# Patient Record
Sex: Male | Born: 1980 | Race: Black or African American | Hispanic: No | Marital: Single | Smoking: Current every day smoker
Health system: Southern US, Community
[De-identification: ages and names within clinical notes are randomized; demographics above are authoritative.]

## PROBLEM LIST (undated history)

## (undated) DIAGNOSIS — K219 Gastro-esophageal reflux disease without esophagitis: Secondary | ICD-10-CM

## (undated) DIAGNOSIS — R480 Dyslexia and alexia: Secondary | ICD-10-CM

## (undated) DIAGNOSIS — Z87442 Personal history of urinary calculi: Secondary | ICD-10-CM

## (undated) DIAGNOSIS — N4830 Priapism, unspecified: Secondary | ICD-10-CM

## (undated) DIAGNOSIS — M549 Dorsalgia, unspecified: Secondary | ICD-10-CM

## (undated) DIAGNOSIS — K5732 Diverticulitis of large intestine without perforation or abscess without bleeding: Secondary | ICD-10-CM

## (undated) DIAGNOSIS — M1990 Unspecified osteoarthritis, unspecified site: Secondary | ICD-10-CM

## (undated) DIAGNOSIS — R7303 Prediabetes: Secondary | ICD-10-CM

## (undated) DIAGNOSIS — G5600 Carpal tunnel syndrome, unspecified upper limb: Secondary | ICD-10-CM

## (undated) DIAGNOSIS — J45909 Unspecified asthma, uncomplicated: Secondary | ICD-10-CM

## (undated) DIAGNOSIS — R0989 Other specified symptoms and signs involving the circulatory and respiratory systems: Secondary | ICD-10-CM

## (undated) DIAGNOSIS — G4730 Sleep apnea, unspecified: Secondary | ICD-10-CM

## (undated) DIAGNOSIS — Z9889 Other specified postprocedural states: Secondary | ICD-10-CM

## (undated) DIAGNOSIS — R112 Nausea with vomiting, unspecified: Secondary | ICD-10-CM

## (undated) DIAGNOSIS — I82409 Acute embolism and thrombosis of unspecified deep veins of unspecified lower extremity: Secondary | ICD-10-CM

## (undated) DIAGNOSIS — I2699 Other pulmonary embolism without acute cor pulmonale: Secondary | ICD-10-CM

## (undated) DIAGNOSIS — D649 Anemia, unspecified: Secondary | ICD-10-CM

## (undated) HISTORY — PX: UPPER GASTROINTESTINAL ENDOSCOPY: SHX188

## (undated) HISTORY — PX: COLONOSCOPY: SHX174

## (undated) HISTORY — PX: WRIST SURGERY: SHX841

## (undated) HISTORY — PX: HEMORRHOID SURGERY: SHX153

## (undated) HISTORY — PX: CHOLECYSTECTOMY: SHX55

---
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
## 2003-01-09 ENCOUNTER — Emergency Department (HOSPITAL_COMMUNITY): Admission: EM | Admit: 2003-01-09 | Discharge: 2003-01-09 | Payer: Self-pay | Admitting: Emergency Medicine

## 2003-01-09 ENCOUNTER — Encounter: Payer: Self-pay | Admitting: *Deleted

## 2004-08-08 ENCOUNTER — Emergency Department (HOSPITAL_COMMUNITY): Admission: EM | Admit: 2004-08-08 | Discharge: 2004-08-08 | Payer: Self-pay | Admitting: Emergency Medicine

## 2004-10-31 ENCOUNTER — Emergency Department (HOSPITAL_COMMUNITY): Admission: EM | Admit: 2004-10-31 | Discharge: 2004-10-31 | Payer: Self-pay | Admitting: *Deleted

## 2006-01-30 ENCOUNTER — Emergency Department (HOSPITAL_COMMUNITY): Admission: EM | Admit: 2006-01-30 | Discharge: 2006-01-30 | Payer: Self-pay | Admitting: Emergency Medicine

## 2006-04-25 ENCOUNTER — Emergency Department (HOSPITAL_COMMUNITY): Admission: EM | Admit: 2006-04-25 | Discharge: 2006-04-25 | Payer: Self-pay | Admitting: Emergency Medicine

## 2006-07-29 ENCOUNTER — Emergency Department (HOSPITAL_COMMUNITY): Admission: EM | Admit: 2006-07-29 | Discharge: 2006-07-29 | Payer: Self-pay | Admitting: Emergency Medicine

## 2006-11-09 ENCOUNTER — Emergency Department (HOSPITAL_COMMUNITY): Admission: EM | Admit: 2006-11-09 | Discharge: 2006-11-09 | Payer: Self-pay | Admitting: Emergency Medicine

## 2007-03-30 ENCOUNTER — Emergency Department (HOSPITAL_COMMUNITY): Admission: EM | Admit: 2007-03-30 | Discharge: 2007-03-30 | Payer: Self-pay | Admitting: Emergency Medicine

## 2007-11-01 ENCOUNTER — Emergency Department (HOSPITAL_COMMUNITY): Admission: EM | Admit: 2007-11-01 | Discharge: 2007-11-01 | Payer: Self-pay | Admitting: Emergency Medicine

## 2007-11-12 ENCOUNTER — Emergency Department (HOSPITAL_COMMUNITY): Admission: EM | Admit: 2007-11-12 | Discharge: 2007-11-12 | Payer: Self-pay | Admitting: Emergency Medicine

## 2008-03-26 ENCOUNTER — Emergency Department (HOSPITAL_COMMUNITY): Admission: EM | Admit: 2008-03-26 | Discharge: 2008-03-26 | Payer: Self-pay | Admitting: Family Medicine

## 2008-03-30 ENCOUNTER — Emergency Department (HOSPITAL_COMMUNITY): Admission: EM | Admit: 2008-03-30 | Discharge: 2008-03-30 | Payer: Self-pay | Admitting: Family Medicine

## 2008-05-16 ENCOUNTER — Emergency Department (HOSPITAL_COMMUNITY): Admission: EM | Admit: 2008-05-16 | Discharge: 2008-05-16 | Payer: Self-pay | Admitting: Emergency Medicine

## 2008-08-22 ENCOUNTER — Emergency Department (HOSPITAL_COMMUNITY): Admission: EM | Admit: 2008-08-22 | Discharge: 2008-08-22 | Payer: Self-pay | Admitting: Emergency Medicine

## 2009-03-13 ENCOUNTER — Emergency Department (HOSPITAL_COMMUNITY): Admission: EM | Admit: 2009-03-13 | Discharge: 2009-03-13 | Payer: Self-pay | Admitting: Emergency Medicine

## 2009-12-07 ENCOUNTER — Emergency Department (HOSPITAL_COMMUNITY): Admission: EM | Admit: 2009-12-07 | Discharge: 2009-12-08 | Payer: Self-pay | Admitting: Emergency Medicine

## 2009-12-08 ENCOUNTER — Emergency Department (HOSPITAL_COMMUNITY): Admission: EM | Admit: 2009-12-08 | Discharge: 2009-12-08 | Payer: Self-pay | Admitting: Emergency Medicine

## 2009-12-29 ENCOUNTER — Emergency Department (HOSPITAL_COMMUNITY)
Admission: EM | Admit: 2009-12-29 | Discharge: 2009-12-29 | Payer: Self-pay | Source: Home / Self Care | Admitting: Emergency Medicine

## 2010-01-09 ENCOUNTER — Emergency Department (HOSPITAL_COMMUNITY)
Admission: EM | Admit: 2010-01-09 | Discharge: 2010-01-10 | Payer: Self-pay | Source: Home / Self Care | Admitting: Emergency Medicine

## 2010-06-23 ENCOUNTER — Emergency Department (HOSPITAL_COMMUNITY)
Admission: EM | Admit: 2010-06-23 | Discharge: 2010-06-23 | Payer: Self-pay | Source: Home / Self Care | Admitting: Emergency Medicine

## 2010-06-30 ENCOUNTER — Inpatient Hospital Stay (INDEPENDENT_AMBULATORY_CARE_PROVIDER_SITE_OTHER)
Admission: RE | Admit: 2010-06-30 | Discharge: 2010-06-30 | Disposition: A | Discharge status: Home / Self Care | Payer: Worker's Compensation | Source: Ambulatory Visit | Attending: Family Medicine | Admitting: Family Medicine

## 2010-06-30 DIAGNOSIS — S61209A Unspecified open wound of unspecified finger without damage to nail, initial encounter: Secondary | ICD-10-CM

## 2010-07-08 ENCOUNTER — Inpatient Hospital Stay (HOSPITAL_COMMUNITY)
Admission: RE | Admit: 2010-07-08 | Discharge: 2010-07-08 | Disposition: A | Discharge status: Home / Self Care | Payer: Worker's Compensation | Source: Ambulatory Visit | Attending: Emergency Medicine | Admitting: Emergency Medicine

## 2010-07-09 ENCOUNTER — Emergency Department (HOSPITAL_COMMUNITY)
Admission: EM | Admit: 2010-07-09 | Discharge: 2010-07-09 | Disposition: A | Discharge status: Home / Self Care | Payer: PRIVATE HEALTH INSURANCE | Attending: Emergency Medicine | Admitting: Emergency Medicine

## 2010-07-09 DIAGNOSIS — Y849 Medical procedure, unspecified as the cause of abnormal reaction of the patient, or of later complication, without mention of misadventure at the time of the procedure: Secondary | ICD-10-CM | POA: Insufficient documentation

## 2010-07-09 DIAGNOSIS — S61209A Unspecified open wound of unspecified finger without damage to nail, initial encounter: Secondary | ICD-10-CM | POA: Insufficient documentation

## 2010-07-09 DIAGNOSIS — T8131XA Disruption of external operation (surgical) wound, not elsewhere classified, initial encounter: Secondary | ICD-10-CM | POA: Insufficient documentation

## 2010-07-13 ENCOUNTER — Emergency Department (HOSPITAL_COMMUNITY)
Admission: EM | Admit: 2010-07-13 | Discharge: 2010-07-13 | Disposition: A | Discharge status: Home / Self Care | Payer: PRIVATE HEALTH INSURANCE | Attending: Emergency Medicine | Admitting: Emergency Medicine

## 2010-07-13 DIAGNOSIS — Z711 Person with feared health complaint in whom no diagnosis is made: Secondary | ICD-10-CM | POA: Insufficient documentation

## 2010-08-12 LAB — DIFFERENTIAL
Basophils Absolute: 0 10*3/uL (ref 0.0–0.1)
Eosinophils Absolute: 0.2 10*3/uL (ref 0.0–0.7)
Eosinophils Relative: 3 % (ref 0–5)
Lymphocytes Relative: 27 % (ref 12–46)
Monocytes Relative: 6 % (ref 3–12)
Neutro Abs: 4.7 10*3/uL (ref 1.7–7.7)
Neutrophils Relative %: 64 % (ref 43–77)

## 2010-08-12 LAB — COMPREHENSIVE METABOLIC PANEL
AST: 34 U/L (ref 0–37)
Albumin: 3.7 g/dL (ref 3.5–5.2)
BUN: 11 mg/dL (ref 6–23)
Calcium: 9.2 mg/dL (ref 8.4–10.5)
Chloride: 106 mEq/L (ref 96–112)
Creatinine, Ser: 0.92 mg/dL (ref 0.4–1.5)
GFR calc Af Amer: >60 mL/min (ref >60)
Glucose, Bld: 106 mg/dL — ABNORMAL HIGH (ref 70–99)
Potassium: 4.3 mEq/L (ref 3.5–5.1)

## 2010-08-12 LAB — CBC
Hemoglobin: 14.2 g/dL (ref 13.0–17.0)
MCH: 27.5 pg (ref 26.0–34.0)
RBC: 5.16 MIL/uL (ref 4.22–5.81)
RDW: 16.6 % — ABNORMAL HIGH (ref 11.5–15.5)

## 2010-08-12 LAB — LIPASE, BLOOD: Lipase: 24 U/L (ref 11–59)

## 2010-08-12 LAB — CLOSTRIDIUM DIFFICILE EIA

## 2010-08-12 LAB — OVA AND PARASITE EXAMINATION

## 2010-08-12 LAB — STOOL CULTURE

## 2010-08-14 LAB — CBC
Hemoglobin: 14.6 g/dL (ref 13.0–17.0)
MCH: 27.4 pg (ref 26.0–34.0)
MCHC: 33.2 g/dL (ref 30.0–36.0)
Platelets: 243 10*3/uL (ref 150–400)
WBC: 12 10*3/uL — ABNORMAL HIGH (ref 4.0–10.5)

## 2010-08-14 LAB — COMPREHENSIVE METABOLIC PANEL
AST: 30 U/L (ref 0–37)
Albumin: 3.5 g/dL (ref 3.5–5.2)
Alkaline Phosphatase: 86 U/L (ref 39–117)
CO2: 23 mEq/L (ref 19–32)
Chloride: 107 mEq/L (ref 96–112)
Creatinine, Ser: 0.97 mg/dL (ref 0.4–1.5)
GFR calc non Af Amer: >60 mL/min (ref >60)
Potassium: 4 mEq/L (ref 3.5–5.1)
Total Bilirubin: 0.3 mg/dL (ref 0.3–1.2)

## 2010-08-14 LAB — DIFFERENTIAL
Basophils Absolute: 0 10*3/uL (ref 0.0–0.1)
Eosinophils Relative: 0 % (ref 0–5)
Lymphocytes Relative: 8 % — ABNORMAL LOW (ref 12–46)
Lymphs Abs: 1 10*3/uL (ref 0.7–4.0)

## 2010-08-14 LAB — LIPASE, BLOOD: Lipase: 20 U/L (ref 11–59)

## 2010-10-06 ENCOUNTER — Emergency Department (HOSPITAL_COMMUNITY)
Admission: EM | Admit: 2010-10-06 | Discharge: 2010-10-06 | Disposition: A | Discharge status: Home / Self Care | Payer: Self-pay | Attending: Emergency Medicine | Admitting: Emergency Medicine

## 2010-10-06 DIAGNOSIS — R209 Unspecified disturbances of skin sensation: Secondary | ICD-10-CM | POA: Insufficient documentation

## 2010-10-06 LAB — GLUCOSE, CAPILLARY: Glucose-Capillary: 132 mg/dL — ABNORMAL HIGH (ref 70–99)

## 2011-02-15 ENCOUNTER — Emergency Department (HOSPITAL_COMMUNITY): Payer: Self-pay

## 2011-02-15 ENCOUNTER — Emergency Department (HOSPITAL_COMMUNITY)
Admission: EM | Admit: 2011-02-15 | Discharge: 2011-02-15 | Disposition: A | Discharge status: Home / Self Care | Payer: Self-pay | Attending: Emergency Medicine | Admitting: Emergency Medicine

## 2011-02-15 DIAGNOSIS — M940 Chondrocostal junction syndrome [Tietze]: Secondary | ICD-10-CM | POA: Insufficient documentation

## 2011-02-15 DIAGNOSIS — R079 Chest pain, unspecified: Secondary | ICD-10-CM | POA: Insufficient documentation

## 2011-02-15 DIAGNOSIS — B9789 Other viral agents as the cause of diseases classified elsewhere: Secondary | ICD-10-CM | POA: Insufficient documentation

## 2011-02-15 LAB — CBC
HCT: 43.8 % (ref 39.0–52.0)
Hemoglobin: 14.2 g/dL (ref 13.0–17.0)
MCV: 81.7 fL (ref 78.0–100.0)
WBC: 7.9 10*3/uL (ref 4.0–10.5)

## 2011-02-15 LAB — DIFFERENTIAL
Basophils Absolute: 0 10*3/uL (ref 0.0–0.1)
Lymphocytes Relative: 29 % (ref 12–46)
Lymphs Abs: 2.3 10*3/uL (ref 0.7–4.0)
Neutro Abs: 4.8 10*3/uL (ref 1.7–7.7)

## 2011-02-15 LAB — BASIC METABOLIC PANEL
BUN: 12 mg/dL (ref 6–23)
CO2: 27 mEq/L (ref 19–32)
Chloride: 100 mEq/L (ref 96–112)
Glucose, Bld: 80 mg/dL (ref 70–99)
Potassium: 3.8 mEq/L (ref 3.5–5.1)

## 2011-02-15 LAB — POCT I-STAT TROPONIN I

## 2011-02-22 ENCOUNTER — Emergency Department (HOSPITAL_COMMUNITY)
Admission: EM | Admit: 2011-02-22 | Discharge: 2011-02-22 | Discharge status: Against Medical Advice | Payer: PRIVATE HEALTH INSURANCE | Attending: Emergency Medicine | Admitting: Emergency Medicine

## 2011-02-22 ENCOUNTER — Emergency Department (HOSPITAL_COMMUNITY): Payer: PRIVATE HEALTH INSURANCE

## 2011-02-22 DIAGNOSIS — R5381 Other malaise: Secondary | ICD-10-CM | POA: Insufficient documentation

## 2011-02-22 DIAGNOSIS — R42 Dizziness and giddiness: Secondary | ICD-10-CM | POA: Insufficient documentation

## 2011-02-23 ENCOUNTER — Emergency Department (HOSPITAL_COMMUNITY)
Admission: EM | Admit: 2011-02-23 | Discharge: 2011-02-24 | Disposition: A | Discharge status: Home / Self Care | Payer: Self-pay | Attending: Emergency Medicine | Admitting: Emergency Medicine

## 2011-02-23 DIAGNOSIS — H6690 Otitis media, unspecified, unspecified ear: Secondary | ICD-10-CM | POA: Insufficient documentation

## 2011-02-23 DIAGNOSIS — E669 Obesity, unspecified: Secondary | ICD-10-CM | POA: Insufficient documentation

## 2011-02-23 DIAGNOSIS — R51 Headache: Secondary | ICD-10-CM | POA: Insufficient documentation

## 2011-02-23 DIAGNOSIS — H9209 Otalgia, unspecified ear: Secondary | ICD-10-CM | POA: Insufficient documentation

## 2011-02-24 LAB — URINALYSIS, ROUTINE W REFLEX MICROSCOPIC
Glucose, UA: NEGATIVE mg/dL
Protein, ur: NEGATIVE mg/dL
Specific Gravity, Urine: 1.027 (ref 1.005–1.030)
pH: 6 (ref 5.0–8.0)

## 2011-02-24 LAB — POCT I-STAT, CHEM 8
BUN: 12 mg/dL (ref 6–23)
Chloride: 99 mEq/L (ref 96–112)
Potassium: 3.7 mEq/L (ref 3.5–5.1)
Sodium: 138 mEq/L (ref 135–145)
TCO2: 25 mmol/L (ref 0–100)

## 2011-02-24 LAB — URINE MICROSCOPIC-ADD ON

## 2011-02-27 LAB — URINALYSIS, ROUTINE W REFLEX MICROSCOPIC
Glucose, UA: NEGATIVE
Hgb urine dipstick: NEGATIVE
Specific Gravity, Urine: 1.017

## 2011-02-27 LAB — CBC
HCT: 36.7 — ABNORMAL LOW
MCV: 75.6 — ABNORMAL LOW
Platelets: 210
RDW: 19.5 — ABNORMAL HIGH

## 2011-02-27 LAB — URINE MICROSCOPIC-ADD ON

## 2011-02-27 LAB — DIFFERENTIAL
Lymphocytes Relative: 4 — ABNORMAL LOW
Monocytes Absolute: 1.3 — ABNORMAL HIGH
Monocytes Relative: 6
Neutro Abs: 18.4 — ABNORMAL HIGH

## 2011-02-27 LAB — COMPREHENSIVE METABOLIC PANEL
Albumin: 2.9 — ABNORMAL LOW
BUN: 5 — ABNORMAL LOW
Creatinine, Ser: 0.94
Potassium: 3.5
Total Protein: 6.5

## 2011-02-27 LAB — URINE CULTURE

## 2011-02-27 LAB — RAPID STREP SCREEN (MED CTR MEBANE ONLY): Streptococcus, Group A Screen (Direct): POSITIVE — AB

## 2011-03-02 LAB — DIFFERENTIAL
Eosinophils Absolute: 0 10*3/uL (ref 0.0–0.7)
Eosinophils Relative: 0 % (ref 0–5)
Lymphs Abs: 0.2 10*3/uL — ABNORMAL LOW (ref 0.7–4.0)
Monocytes Absolute: 0.4 10*3/uL (ref 0.1–1.0)

## 2011-03-02 LAB — CBC
HCT: 42.9 % (ref 39.0–52.0)
Hemoglobin: 13.7 g/dL (ref 13.0–17.0)
MCV: 77.3 fL — ABNORMAL LOW (ref 78.0–100.0)
Platelets: 285 10*3/uL (ref 150–400)
WBC: 8.7 10*3/uL (ref 4.0–10.5)

## 2011-03-02 LAB — BASIC METABOLIC PANEL
BUN: 14 mg/dL (ref 6–23)
Chloride: 104 mEq/L (ref 96–112)
Glucose, Bld: 117 mg/dL — ABNORMAL HIGH (ref 70–99)
Potassium: 4.2 mEq/L (ref 3.5–5.1)

## 2011-03-07 LAB — URINALYSIS, ROUTINE W REFLEX MICROSCOPIC
Bilirubin Urine: NEGATIVE
Glucose, UA: NEGATIVE
Specific Gravity, Urine: 1.028
pH: 8

## 2011-03-07 LAB — GC/CHLAMYDIA PROBE AMP, GENITAL
Chlamydia, DNA Probe: NEGATIVE
GC Probe Amp, Genital: NEGATIVE

## 2011-03-07 LAB — URINE MICROSCOPIC-ADD ON

## 2011-03-07 LAB — POCT URINE HEMOGLOBIN: Operator id: 22937

## 2011-03-16 LAB — BASIC METABOLIC PANEL
BUN: 5 — ABNORMAL LOW
Calcium: 9
Creatinine, Ser: 0.93
GFR calc Af Amer: >60
GFR calc non Af Amer: >60

## 2011-03-16 LAB — DIFFERENTIAL
Eosinophils Absolute: 0.1
Lymphocytes Relative: 18
Lymphs Abs: 1
Neutro Abs: 4
Neutrophils Relative %: 72

## 2011-03-16 LAB — SICKLE CELL SCREEN: Sickle Cell Screen: NEGATIVE

## 2011-03-16 LAB — CBC
MCV: 71.2 — ABNORMAL LOW
Platelets: 309
WBC: 5.5

## 2011-03-27 ENCOUNTER — Emergency Department (HOSPITAL_COMMUNITY): Payer: Self-pay

## 2011-03-27 ENCOUNTER — Emergency Department (HOSPITAL_COMMUNITY)
Admission: EM | Admit: 2011-03-27 | Discharge: 2011-03-27 | Disposition: A | Discharge status: Home / Self Care | Payer: Self-pay | Attending: Emergency Medicine | Admitting: Emergency Medicine

## 2011-03-27 DIAGNOSIS — M79609 Pain in unspecified limb: Secondary | ICD-10-CM | POA: Insufficient documentation

## 2011-05-12 ENCOUNTER — Emergency Department (HOSPITAL_COMMUNITY)
Admission: EM | Admit: 2011-05-12 | Discharge: 2011-05-12 | Disposition: A | Discharge status: Home / Self Care | Payer: Self-pay | Attending: Emergency Medicine | Admitting: Emergency Medicine

## 2011-05-12 ENCOUNTER — Encounter: Payer: Self-pay | Admitting: Emergency Medicine

## 2011-05-12 DIAGNOSIS — R3915 Urgency of urination: Secondary | ICD-10-CM | POA: Insufficient documentation

## 2011-05-12 DIAGNOSIS — R109 Unspecified abdominal pain: Secondary | ICD-10-CM | POA: Insufficient documentation

## 2011-05-12 DIAGNOSIS — G4483 Primary cough headache: Secondary | ICD-10-CM | POA: Insufficient documentation

## 2011-05-12 DIAGNOSIS — R110 Nausea: Secondary | ICD-10-CM | POA: Insufficient documentation

## 2011-05-12 DIAGNOSIS — R6883 Chills (without fever): Secondary | ICD-10-CM | POA: Insufficient documentation

## 2011-05-12 DIAGNOSIS — H938X9 Other specified disorders of ear, unspecified ear: Secondary | ICD-10-CM | POA: Insufficient documentation

## 2011-05-12 LAB — URINALYSIS, ROUTINE W REFLEX MICROSCOPIC
Ketones, ur: NEGATIVE mg/dL
Leukocytes, UA: NEGATIVE
Nitrite: NEGATIVE
pH: 6.5 (ref 5.0–8.0)

## 2011-05-12 MED ORDER — ONDANSETRON 8 MG PO TBDP
8.0000 mg | ORAL_TABLET | Freq: Once | ORAL | Status: AC
  Administered 2011-05-12: 8 mg via ORAL
  Filled 2011-05-12: qty 1

## 2011-05-12 MED ORDER — ONDANSETRON HCL 4 MG PO TABS: 4.0000 mg | ORAL_TABLET | Freq: Four times a day (QID) | ORAL | Status: AC

## 2011-05-12 MED ORDER — IBUPROFEN 800 MG PO TABS
800.0000 mg | ORAL_TABLET | Freq: Once | ORAL | Status: AC
  Administered 2011-05-12: 800 mg via ORAL
  Filled 2011-05-12: qty 1

## 2011-05-12 NOTE — ED Notes (Signed)
Patient stable upon discharge.  

## 2011-05-12 NOTE — ED Provider Notes (Signed)
History     CSN: [id number] Arrival date & time: 05/12/2011  5:26 PM   First MD Initiated Contact with Patient 05/12/11 2047      Chief Complaint  Patient presents with  . Abdominal Pain    (Consider location/radiation/quality/duration/timing/severity/associated sxs/prior treatment) Patient is a 30 y.o. male presenting with abdominal pain. The history is provided by the patient. No language interpreter was used.  Abdominal Pain The primary symptoms of the illness include abdominal pain and nausea. The primary symptoms of the illness do not include fever, fatigue, vomiting, diarrhea, hematemesis, hematochezia or dysuria. The current episode started 3 to 5 hours ago. The onset of the illness was gradual. The problem has been gradually worsening.  The illness is associated with awakening from sleep. The patient states that she believes she is currently not pregnant. The patient has not had a change in bowel habit. Additional symptoms associated with the illness include chills, diaphoresis, urgency, frequency and back pain. Symptoms associated with the illness do not include anorexia, heartburn, constipation or hematuria. Significant associated medical issues do not include PUD, GERD, inflammatory bowel disease, diabetes, sickle cell disease, gallstones, liver disease, substance abuse, diverticulitis, HIV or cardiac disease.  Reports cough and sore throat x 2 days.  Woke around 4 pm today with bilateral side pain 10/10 and nauseated.  Also c/o urinary problems such as urgency, frequency and back pain.  States that his girlfriend has the flu. Low grade fever. No flu shot.  No past medical history on file.  Past Surgical History  Procedure Date  . Cholecystectomy     No family history on file.  History  Substance Use Topics  . Smoking status: Current Everyday Smoker  . Smokeless tobacco: Never Used  . Alcohol Use: Yes      Review of Systems  Constitutional: Positive for chills and  diaphoresis. Negative for fever and fatigue.  Gastrointestinal: Positive for nausea and abdominal pain. Negative for heartburn, vomiting, diarrhea, constipation, hematochezia, anorexia and hematemesis.  Genitourinary: Positive for urgency and frequency. Negative for dysuria and hematuria.  Musculoskeletal: Positive for back pain.  All other systems reviewed and are negative.    Allergies  Review of patient's allergies indicates no known allergies.  Home Medications   Current Outpatient Rx  Name Route Sig Dispense Refill  . ACETAMINOPHEN 500 MG PO TABS Oral Take 1,000 mg by mouth every 6 (six) hours as needed. pain     . GUAIFENESIN ER 600 MG PO TB12 Oral Take 1,200 mg by mouth 2 (two) times daily.        BP 144/73  Pulse 79  Temp(Src) 97.9 F (36.6 C) (Oral)  Resp 20  SpO2 99%  Physical Exam  Constitutional: He is oriented to person, place, and time. He appears well-developed and well-nourished.       Morbidly obese  HENT:  Right Ear: There is swelling and tenderness. No drainage. Tympanic membrane is erythematous.  Left Ear: Tympanic membrane normal. No drainage, swelling or tenderness.  Eyes: Pupils are equal, round, and reactive to light.  Neck: Normal range of motion. Neck supple.  Cardiovascular: Normal rate.   Pulmonary/Chest: Effort normal and breath sounds normal. No respiratory distress. He has no wheezes. He has no rales.  Abdominal: Soft. Bowel sounds are normal. He exhibits no distension and no mass. There is no tenderness. There is no rebound and no guarding.  Musculoskeletal: Normal range of motion. He exhibits tenderness.       General muscle aches and  pains  Neurological: He is alert and oriented to person, place, and time.  Skin: Skin is warm and dry.  Psychiatric: He has a normal mood and affect.    ED Course  Procedures (including critical care time)  Labs Reviewed - No data to display No results found.   No diagnosis found.    MDM  Flulike  symptoms x2 days with sick contacts. States that his girlfriend had the same symptoms. Some relief with Zofran and ibuprofen and benadryl in the ER today. Tolerating by mouth's. Will return if worse no PCP.        Jethro Bastos, NP 05/13/11 [phone]

## 2011-05-12 NOTE — ED Notes (Signed)
Pt reports abdominal pain that started 1 hour ago. Pt states pain is mainly on his right side and into right flank area but does have some pain on the left. Pt nauseated, denies vomiting and diarrhea. Pt also has cough for the past 3 days.

## 2011-05-13 ENCOUNTER — Encounter (HOSPITAL_COMMUNITY): Payer: Self-pay | Admitting: Emergency Medicine

## 2011-05-14 NOTE — ED Provider Notes (Signed)
Medical screening examination/treatment/procedure(s) were performed by non-physician practitioner and as supervising physician I was immediately available for consultation/collaboration.  Jamine Highfill M Dailey Buccheri, MD 05/14/11 0729 

## 2011-07-20 ENCOUNTER — Emergency Department (HOSPITAL_COMMUNITY)
Admission: EM | Admit: 2011-07-20 | Discharge: 2011-07-20 | Disposition: A | Discharge status: Home / Self Care | Payer: PRIVATE HEALTH INSURANCE | Attending: Emergency Medicine | Admitting: Emergency Medicine

## 2011-07-20 ENCOUNTER — Emergency Department (HOSPITAL_COMMUNITY): Payer: PRIVATE HEALTH INSURANCE

## 2011-07-20 ENCOUNTER — Encounter (HOSPITAL_COMMUNITY): Payer: Self-pay | Admitting: Emergency Medicine

## 2011-07-20 DIAGNOSIS — M545 Low back pain, unspecified: Secondary | ICD-10-CM | POA: Insufficient documentation

## 2011-07-20 DIAGNOSIS — R209 Unspecified disturbances of skin sensation: Secondary | ICD-10-CM | POA: Insufficient documentation

## 2011-07-20 DIAGNOSIS — M79609 Pain in unspecified limb: Secondary | ICD-10-CM | POA: Insufficient documentation

## 2011-07-20 DIAGNOSIS — F17200 Nicotine dependence, unspecified, uncomplicated: Secondary | ICD-10-CM | POA: Insufficient documentation

## 2011-07-20 MED ORDER — DIAZEPAM 5 MG PO TABS: 5.0000 mg | ORAL_TABLET | Freq: Four times a day (QID) | ORAL | Status: AC | PRN

## 2011-07-20 MED ORDER — HYDROCODONE-ACETAMINOPHEN 5-325 MG PO TABS
1.0000 | ORAL_TABLET | Freq: Once | ORAL | Status: AC
  Administered 2011-07-20: 1 via ORAL
  Filled 2011-07-20: qty 1

## 2011-07-20 MED ORDER — DIAZEPAM 5 MG PO TABS
5.0000 mg | ORAL_TABLET | Freq: Once | ORAL | Status: AC
  Administered 2011-07-20: 5 mg via ORAL
  Filled 2011-07-20: qty 1

## 2011-07-20 MED ORDER — HYDROCODONE-ACETAMINOPHEN 5-325 MG PO TABS: 1.0000 | ORAL_TABLET | ORAL | Status: AC | PRN

## 2011-07-20 NOTE — ED Notes (Signed)
Pt has been having back pain since the beginning of this year and states that it just keeps getting worse. Takes 800mg  ibuprofen with no relief. States he works on his feet. No urinary symptoms.

## 2011-07-20 NOTE — ED Provider Notes (Signed)
Medical screening examination/treatment/procedure(s) were performed by non-physician practitioner and as supervising physician I was immediately available for consultation/collaboration.   Ladislao Cohenour, MD 07/20/11 2354 

## 2011-07-20 NOTE — Discharge Instructions (Signed)
Back Pain, Adult Low back pain is very common. About 1 in 5 people have back pain.The cause of low back pain is rarely dangerous. The pain often gets better over time.About half of people with a sudden onset of back pain feel better in just 2 weeks. About 8 in 10 people feel better by 6 weeks.  CAUSES Some common causes of back pain include:  Strain of the muscles or ligaments supporting the spine.   Wear and tear (degeneration) of the spinal discs.   Arthritis.   Direct injury to the back.  DIAGNOSIS Most of the time, the direct cause of low back pain is not known.However, back pain can be treated effectively even when the exact cause of the pain is unknown.Answering your caregiver's questions about your overall health and symptoms is one of the most accurate ways to make sure the cause of your pain is not dangerous. If your caregiver needs more information, he or she may order lab work or imaging tests (X-rays or MRIs).However, even if imaging tests show changes in your back, this usually does not require surgery. HOME CARE INSTRUCTIONS For many people, back pain returns.Since low back pain is rarely dangerous, it is often a condition that people can learn to manageon their own.   Remain active. It is stressful on the back to sit or stand in one place. Do not sit, drive, or stand in one place for more than 30 minutes at a time. Take short walks on level surfaces as soon as pain allows.Try to increase the length of time you walk each day.   Do not stay in bed.Resting more than 1 or 2 days can delay your recovery.   Do not avoid exercise or work.Your body is made to move.It is not dangerous to be active, even though your back may hurt.Your back will likely heal faster if you return to being active before your pain is gone.   Pay attention to your body when you bend and lift. Many people have less discomfortwhen lifting if they bend their knees, keep the load close to their  bodies,and avoid twisting. Often, the most comfortable positions are those that put less stress on your recovering back.   Find a comfortable position to sleep. Use a firm mattress and lie on your side with your knees slightly bent. If you lie on your back, put a pillow under your knees.   Only take over-the-counter or prescription medicines as directed by your caregiver. Over-the-counter medicines to reduce pain and inflammation are often the most helpful.Your caregiver may prescribe muscle relaxant drugs.These medicines help dull your pain so you can more quickly return to your normal activities and healthy exercise.   Put ice on the injured area.   Put ice in a plastic bag.   Place a towel between your skin and the bag.   Leave the ice on for 15 to 20 minutes, 3 to 4 times a day for the first 2 to 3 days. After that, ice and heat may be alternated to reduce pain and spasms.   Ask your caregiver about trying back exercises and gentle massage. This may be of some benefit.   Avoid feeling anxious or stressed.Stress increases muscle tension and can worsen back pain.It is important to recognize when you are anxious or stressed and learn ways to manage it.Exercise is a great option.  SEEK MEDICAL CARE IF:  You have pain that is not relieved with rest or medicine.   You have   pain that does not improve in 1 week.   You have new symptoms.   You are generally not feeling well.  SEEK IMMEDIATE MEDICAL CARE IF:   You have pain that radiates from your back into your legs.   You develop new bowel or bladder control problems.   You have unusual weakness or numbness in your arms or legs.   You develop nausea or vomiting.   You develop abdominal pain.   You feel faint.  Document Released: 05/15/2005 Document Revised: 01/25/2011 Document Reviewed: 10/03/2010 Exit[employer] Patient Information 2012 [employer].Lumbosacral Strain Lumbosacral strain is one of the most common causes of back  pain. There are many causes of back pain. Most are not serious conditions. CAUSES  Your backbone (spinal column) is made up of 24 main vertebral bodies, the sacrum, and the coccyx. These are held together by muscles and tough, fibrous tissue (ligaments). Nerve roots pass through the openings between the vertebrae. A sudden move or injury to the back may cause injury to, or pressure on, these nerves. This may result in localized back pain or pain movement (radiation) into the buttocks, down the leg, and into the foot. Sharp, shooting pain from the buttock down the back of the leg (sciatica) is frequently associated with a ruptured (herniated) disk. Pain may be caused by muscle spasm alone. Your caregiver can often find the cause of your pain by the details of your symptoms and an exam. In some cases, you may need tests (such as X-rays). Your caregiver will work with you to decide if any tests are needed based on your specific exam. HOME CARE INSTRUCTIONS   Avoid an underactive lifestyle. Active exercise, as directed by your caregiver, is your greatest weapon against back pain.   Avoid hard physical activities (tennis, racquetball, waterskiing) if you are not in proper physical condition for it. This may aggravate or create problems.   If you have a back problem, avoid sports requiring sudden body movements. Swimming and walking are generally safer activities.   Maintain good posture.   Avoid becoming overweight (obese).   Use bed rest for only the most extreme, sudden (acute) episode. Your caregiver will help you determine how much bed rest is necessary.   For acute conditions, you may put ice on the injured area.   Put ice in a plastic bag.   Place a towel between your skin and the bag.   Leave the ice on for 15 to 20 minutes at a time, every 2 hours, or as needed.   After you are improved and more active, it may help to apply heat for 30 minutes before activities.  See your caregiver if  you are having pain that lasts longer than expected. Your caregiver can advise appropriate exercises or therapy if needed. With conditioning, most back problems can be avoided. SEEK IMMEDIATE MEDICAL CARE IF:   You have numbness, tingling, weakness, or problems with the use of your arms or legs.   You experience severe back pain not relieved with medicines.   There is a change in bowel or bladder control.   You have increasing pain in any area of the body, including your belly (abdomen).   You notice shortness of breath, dizziness, or feel faint.   You feel sick to your stomach (nauseous), are throwing up (vomiting), or become sweaty.   You notice discoloration of your toes or legs, or your feet get very cold.   Your back pain is getting worse.     You have a fever.  MAKE SURE YOU:   Understand these instructions.   Will watch your condition.   Will get help right away if you are not doing well or get worse.  Document Released: 02/22/2005 Document Revised: 01/25/2011 Document Reviewed: 08/14/2008 Exit[employer] Patient Information 2012 [employer]. 

## 2011-07-20 NOTE — ED Provider Notes (Signed)
History     CSN: [id number]  Arrival date & time 07/20/11  1478   First MD Initiated Contact with Patient 07/20/11 1901      Chief Complaint  Patient presents with  . Back Pain    (Consider location/radiation/quality/duration/timing/severity/associated sxs/prior treatment) HPI Comments: Patient here with worsening lower back pain with radiation down to right thigh - reports that he has also been having periodic numbness and tingling in the right leg as well - states no fever, chills, weakness, reports no loss of control of bowels or bladder -   Patient is a 31 y.o. male presenting with back pain. The history is provided by the patient. No language interpreter was used.  Back Pain  This is a recurrent problem. The current episode started more than 1 week ago. The problem occurs constantly. The problem has not changed since onset.The pain is associated with no known injury. The pain is present in the lumbar spine. The quality of the pain is described as stabbing. The pain radiates to the right thigh. The pain is at a severity of 10/10. The pain is severe. The symptoms are aggravated by bending and twisting. The pain is the same all the time. Stiffness is present all day. Associated symptoms include numbness, leg pain and tingling. Pertinent negatives include no chest pain, no fever, no weight loss, no headaches, no abdominal pain, no abdominal swelling, no bowel incontinence, no perianal numbness, no bladder incontinence, no dysuria, no pelvic pain, no paresthesias, no paresis and no weakness. He has tried nothing for the symptoms. The treatment provided no relief. Risk factors include obesity and lack of exercise.    History reviewed. No pertinent past medical history.  Past Surgical History  Procedure Date  . Cholecystectomy   . Wrist surgery     No family history on file.  History  Substance Use Topics  . Smoking status: Current Everyday Smoker  . Smokeless tobacco: Never Used  .  Alcohol Use: Yes      Review of Systems  Constitutional: Negative for fever and weight loss.  Cardiovascular: Negative for chest pain.  Gastrointestinal: Negative for abdominal pain and bowel incontinence.  Genitourinary: Negative for bladder incontinence, dysuria and pelvic pain.  Musculoskeletal: Positive for back pain.  Neurological: Positive for tingling and numbness. Negative for weakness, headaches and paresthesias.  All other systems reviewed and are negative.    Allergies  Review of patient's allergies indicates no known allergies.  Home Medications   Current Outpatient Rx  Name Route Sig Dispense Refill  . IBUPROFEN 800 MG PO TABS Oral Take 800 mg by mouth every 8 (eight) hours as needed. pain      BP 146/76  Pulse 82  Temp(Src) 97.7 F (36.5 C) (Oral)  Resp 18  SpO2 99%  Physical Exam  Nursing note and vitals reviewed. Constitutional: He is oriented to person, place, and time. He appears well-developed and well-nourished. No distress.  HENT:  Head: Normocephalic and atraumatic.  Right Ear: External ear normal.  Left Ear: External ear normal.  Nose: Nose normal.  Mouth/Throat: Oropharynx is clear and moist. No oropharyngeal exudate.  Eyes: Conjunctivae are normal. Pupils are equal, round, and reactive to light. No scleral icterus.  Neck: Normal range of motion. Neck supple.  Cardiovascular: Normal rate, regular rhythm and normal heart sounds.  Exam reveals no gallop and no friction rub.   No murmur heard. Pulmonary/Chest: Effort normal and breath sounds normal. No respiratory distress. He exhibits no tenderness.  Abdominal:  Soft. Bowel sounds are normal. He exhibits no distension. There is no tenderness.  Musculoskeletal:       Lumbar back: He exhibits tenderness and bony tenderness. He exhibits normal range of motion and no deformity.  Lymphadenopathy:    He has no cervical adenopathy.  Neurological: He is alert and oriented to person, place, and time. He  has normal reflexes. No cranial nerve deficit. He exhibits normal muscle tone. Coordination normal.  Skin: Skin is warm and dry. No rash noted. No erythema. No pallor.  Psychiatric: He has a normal mood and affect. His behavior is normal. Judgment and thought content normal.    ED Course  Procedures (including critical care time)  Labs Reviewed - No data to display Ct Lumbar Spine Wo Contrast  07/20/2011  *RADIOLOGY REPORT*  Clinical Data: Low back pain.  No known injury.  CT LUMBAR SPINE WITHOUT CONTRAST  Technique:  Multidetector CT imaging of the lumbar spine was performed without intravenous contrast administration. Multiplanar CT image reconstructions were also generated.  Comparison: Previous examinations, including the abdomen CT dated 01/09/2010.  Findings: The examination is limited by [place] photon starvation due to the large size of the patient.  Five non-rib bearing lumbar vertebrae.  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  Normal alignment is again demonstrated.  No disc bulges or disc herniations are visualized. The spinal canal and neural foramen are normal in caliber at all levels.  IMPRESSION: Stable bilateral L5 spondylolysis extending through the L5 spinous process with secondary bony hypertrophy on the right.  Normal alignment.  Original Report Authenticated By: Darrol Angel, M.D.     Low back pain    MDM  Patient with worsening lower back pain - is morbidly obese and is aware that loosing weight will help with his back pain - does not have PCP for follow up - will refer to a PCP and then prescribe short course of pain medication and muscle relaxer.  No alarming findings suggesting cauda equina or epidural hematoma.        Izola Price [place], [place] 07/20/11 2101

## 2011-07-23 ENCOUNTER — Encounter (HOSPITAL_COMMUNITY): Payer: Self-pay | Admitting: Emergency Medicine

## 2011-07-23 ENCOUNTER — Emergency Department (HOSPITAL_COMMUNITY)
Admission: EM | Admit: 2011-07-23 | Discharge: 2011-07-23 | Disposition: A | Discharge status: Home / Self Care | Payer: Self-pay | Attending: Emergency Medicine | Admitting: Emergency Medicine

## 2011-07-23 DIAGNOSIS — M549 Dorsalgia, unspecified: Secondary | ICD-10-CM | POA: Insufficient documentation

## 2011-07-23 DIAGNOSIS — M479 Spondylosis, unspecified: Secondary | ICD-10-CM | POA: Insufficient documentation

## 2011-07-23 DIAGNOSIS — M62838 Other muscle spasm: Secondary | ICD-10-CM | POA: Insufficient documentation

## 2011-07-23 MED ORDER — METHOCARBAMOL 500 MG PO TABS: 500.0000 mg | ORAL_TABLET | Freq: Two times a day (BID) | ORAL | Status: AC | PRN

## 2011-07-23 MED ORDER — KETOROLAC TROMETHAMINE 60 MG/2ML IM SOLN
60.0000 mg | Freq: Once | INTRAMUSCULAR | Status: AC
  Administered 2011-07-23: 60 mg via INTRAMUSCULAR
  Filled 2011-07-23: qty 2

## 2011-07-23 MED ORDER — DEXAMETHASONE SODIUM PHOSPHATE 10 MG/ML IJ SOLN
10.0000 mg | Freq: Once | INTRAMUSCULAR | Status: AC
  Administered 2011-07-23: 10 mg via INTRAMUSCULAR
  Filled 2011-07-23: qty 1

## 2011-07-23 MED ORDER — NAPROXEN 500 MG PO TABS: 500.0000 mg | ORAL_TABLET | Freq: Two times a day (BID) | ORAL | Status: DC

## 2011-07-23 NOTE — ED Notes (Signed)
Patient is AOx4 and comfortable with his discharge instructions. 

## 2011-07-23 NOTE — ED Notes (Addendum)
Patient complaining of lower back pain -- patient states that he has had back pain for several days, but that the pain became increasingly worse over the course of the day yesterday.  Patient has been diagnosed with a slipped disc in his lower back (disc has slipped forward); patient was seen at [facility] several days ago for the same symptoms.  Patient able to move all extremities freely; patient prefers to stand rather than sit due to his back pain.  Pain does not radiate anywhere; denies any other symptoms.

## 2011-07-23 NOTE — ED Provider Notes (Signed)
History     CSN: [id number]  Arrival date & time 07/23/11  0435   First MD Initiated Contact with Patient 07/23/11 [phone]      Chief Complaint  Patient presents with  . 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  Has not seen surgeon, has not been tx withi NSAIDs.  Patient is a 31 y.o. male presenting with back pain. The history is provided by the patient and medical records.  Back Pain  This is a chronic problem. Episode onset: 3 months ago. The problem occurs constantly. The problem has been gradually worsening. The pain is associated with no known injury. Pain location: lower back and mid back. paraspinal location. The quality of the pain is described as aching and cramping. The pain does not radiate. Pertinent negatives include no numbness and no weakness.    History reviewed. No pertinent past medical history.  Past Surgical History  Procedure Date  . Cholecystectomy   . Wrist surgery     History reviewed. No pertinent family history.  History  Substance Use Topics  . Smoking status: Current Everyday Smoker  . Smokeless tobacco: Never Used  . Alcohol Use: Yes      Review of Systems  Musculoskeletal: Positive for back pain.  Neurological: Negative for weakness and numbness.    Allergies  Review of patient's allergies indicates no known allergies.  Home Medications   Current Outpatient Rx  Name Route Sig Dispense Refill  . DIAZEPAM 5 MG PO TABS Oral Take 1 tablet (5 mg total) by mouth every 6 (six) hours as needed (muscle spasm). 30 tablet 0  . HYDROCODONE-ACETAMINOPHEN 5-325 MG PO  TABS Oral Take 1 tablet by mouth every 4 (four) hours as needed for pain. 30 tablet 0  . IBUPROFEN 800 MG PO TABS Oral Take 800 mg by mouth every 8 (eight) hours as needed. pain    . METHOCARBAMOL 500 MG PO TABS Oral Take 1 tablet (500 mg total) by mouth 2 (two) times daily as needed. 20 tablet 0  . NAPROXEN 500 MG PO TABS Oral Take 1 tablet (500 mg total) by mouth 2 (two) times daily with a meal. 30 tablet 0    BP 182/88  Pulse 93  Temp(Src) 97.8 F (36.6 C) (Oral)  Resp 20  Ht 5\' 6"  (1.676 m)  Wt 420 lb (190.511 kg)  BMI 67.79 kg/m2  SpO2 98%  Physical Exam  Nursing note and vitals reviewed. Constitutional: He appears well-developed and well-nourished. No distress.  HENT:  Head: Normocephalic and atraumatic.  Eyes: Conjunctivae are normal. No scleral icterus.  Cardiovascular: Normal rate, regular rhythm and intact distal pulses.   Pulmonary/Chest: Effort normal and breath sounds normal.  Musculoskeletal: He exhibits tenderness. He exhibits no edema.       ttp in the parasspinal muscles of mid back and lower back, no central ttp  Neurological: He is alert.       Normal neuro exam of LE's, including strength sensation, and ambulation  Skin: Skin is warm and dry. No rash noted. He is not diaphoretic.    ED Course  Procedures (including critical care  time)  Labs Reviewed - No data to display No results found.   1. Spondylosis   2. Muscle spasm       MDM  CT reviewed, no acute neuro sx, toradol, decadron, f/u with NS.  Home on naprosyn.        Vida Roller, MD 07/23/11 [phone]

## 2011-07-23 NOTE — Discharge Instructions (Signed)
Call the doctor listed above for follow up - return to the ER for severe or worsening symptoms.

## 2011-09-29 ENCOUNTER — Emergency Department (HOSPITAL_COMMUNITY)
Admission: EM | Admit: 2011-09-29 | Discharge: 2011-09-29 | Disposition: A | Discharge status: Home / Self Care | Payer: Self-pay | Attending: Emergency Medicine | Admitting: Emergency Medicine

## 2011-09-29 ENCOUNTER — Emergency Department (HOSPITAL_COMMUNITY): Payer: Self-pay

## 2011-09-29 ENCOUNTER — Encounter (HOSPITAL_COMMUNITY): Payer: Self-pay | Admitting: Emergency Medicine

## 2011-09-29 DIAGNOSIS — Z9889 Other specified postprocedural states: Secondary | ICD-10-CM | POA: Insufficient documentation

## 2011-09-29 DIAGNOSIS — F17200 Nicotine dependence, unspecified, uncomplicated: Secondary | ICD-10-CM | POA: Insufficient documentation

## 2011-09-29 DIAGNOSIS — N509 Disorder of male genital organs, unspecified: Secondary | ICD-10-CM | POA: Insufficient documentation

## 2011-09-29 DIAGNOSIS — Z79899 Other long term (current) drug therapy: Secondary | ICD-10-CM | POA: Insufficient documentation

## 2011-09-29 DIAGNOSIS — N453 Epididymo-orchitis: Secondary | ICD-10-CM | POA: Insufficient documentation

## 2011-09-29 DIAGNOSIS — N451 Epididymitis: Secondary | ICD-10-CM

## 2011-09-29 LAB — URINALYSIS, ROUTINE W REFLEX MICROSCOPIC
Bilirubin Urine: NEGATIVE
Ketones, ur: NEGATIVE mg/dL
Nitrite: NEGATIVE
Specific Gravity, Urine: 1.019 (ref 1.005–1.030)
Urobilinogen, UA: 0.2 mg/dL (ref 0.0–1.0)

## 2011-09-29 MED ORDER — AZITHROMYCIN 250 MG PO TABS
1000.0000 mg | ORAL_TABLET | Freq: Once | ORAL | Status: AC
  Administered 2011-09-29: 1000 mg via ORAL
  Filled 2011-09-29: qty 4

## 2011-09-29 MED ORDER — CEFTRIAXONE SODIUM 250 MG IJ SOLR
250.0000 mg | Freq: Once | INTRAMUSCULAR | Status: AC
  Administered 2011-09-29: 250 mg via INTRAMUSCULAR
  Filled 2011-09-29: qty 250

## 2011-09-29 NOTE — ED Notes (Signed)
Pt presenting to ed with c/o testicle swelling and pain. Pt states onset x one week pt states pain is worse with walking. Pt is alert and oriented at this time. Pt denies any discharge. Pt denies any difficulty with urination. Pt denies redness at this time

## 2011-09-29 NOTE — ED Provider Notes (Signed)
History     CSN: [id number]  Arrival date & time 09/29/11  1719   First MD Initiated Contact with Patient 09/29/11 2013      Chief Complaint  Patient presents with  . Testicle Pain    (Consider location/radiation/quality/duration/timing/severity/associated sxs/prior treatment) HPI Complains of left testicular pain and swelling for one week pain is worse with walking, not improved by anything no dysuria no discharge from penis. Treated with tramadol without relief. No fever. No other associated symptom Past Medical History  Diagnosis Date  . Morbid obesity    Chronic back pain Past Surgical History  Procedure Date  . Cholecystectomy   . Wrist surgery     No family history on file.  History  Substance Use Topics  . Smoking status: Current Everyday Smoker  . Smokeless tobacco: Never Used  . Alcohol Use: Yes     socially      Review of Systems  Constitutional: Negative.   HENT: Negative.   Respiratory: Negative.   Cardiovascular: Negative.   Gastrointestinal: Negative.   Genitourinary: Positive for testicular pain.       Problems maintaing erections  Musculoskeletal: Negative.   Skin: Negative.   Neurological: Negative.   Hematological: Negative.   Psychiatric/Behavioral: Negative.     Allergies  Review of patient's allergies indicates no known allergies.  Home Medications   Current Outpatient Rx  Name Route Sig Dispense Refill  . CYCLOBENZAPRINE HCL 10 MG PO TABS Oral Take 10 mg by mouth 3 (three) times daily as needed. For muscle spasms    . OXYCODONE-ACETAMINOPHEN 5-325 MG PO TABS Oral Take 1 tablet by mouth every 4 (four) hours as needed. For pain    . TRAMADOL HCL 50 MG PO TABS Oral Take 50 mg by mouth every 6 (six) hours as needed. For pain      BP 167/69  Pulse 86  Temp(Src) 98.2 F (36.8 C) (Oral)  Resp 18  SpO2 100%  Physical Exam  Nursing note and vitals reviewed. Constitutional: He appears well-developed and well-nourished.  HENT:    Head: Normocephalic and atraumatic.  Eyes: Conjunctivae are normal. Pupils are equal, round, and reactive to light.  Neck: Neck supple. No tracheal deviation present. No thyromegaly present.  Cardiovascular: Normal rate and regular rhythm.   No murmur heard. Pulmonary/Chest: Effort normal and breath sounds normal.  Abdominal: Soft. Bowel sounds are normal. He exhibits no distension. There is no tenderness.       Morbidly obese  Genitourinary: Penis normal.       circumcized , no scrotal masses. Both testes in normal lie, left testicle milldy tender  Musculoskeletal: Normal range of motion. He exhibits no edema and no tenderness.  Neurological: He is alert. Coordination normal.  Skin: Skin is warm and dry. No rash noted.  Psychiatric: He has a normal mood and affect.    ED Course  Procedures (including critical care time)  Labs Reviewed - No data to display No results found.   No diagnosis found.    MDM  Her chronic pain medicine not given in the emergency department as patient is driving home Plan Rocephin, Zithromax prior to discharge Urology referral, patient to take Percocet which he has at home as needed for pain Blood pressure recheck Diagnosis #1 epididymitis #2 elevated blood pressure        Doug Sou, MD 09/29/11 2157

## 2011-09-29 NOTE — Discharge Instructions (Signed)
Epididymitis Epididymitis is a swelling (inflammation) of the epididymis. The epididymis is a cord-like structure along the back part of the testicle. Epididymitis is usually, but not always, caused by infection. This is usually a sudden problem beginning with chills, fever and pain behind the scrotum and in the testicle. There may be swelling and redness of the testicle. DIAGNOSIS  Physical examination will reveal a tender, swollen epididymis. Sometimes, cultures are obtained from the urine or from prostate secretions to help find out if there is an infection or if the cause is a different problem. Sometimes, blood work is performed to see if your white blood cell count is elevated and if a germ (bacterial) or viral infection is present. Using this knowledge, an appropriate medicine which kills germs (antibiotic) can be chosen by your caregiver. A viral infection causing epididymitis will most often go away (resolve) without treatment. HOME CARE INSTRUCTIONS   Hot sitz baths for 20 minutes, 4 times per day, may help relieve pain.   Only take over-the-counter or prescription medicines for pain, discomfort or fever as directed by your caregiver.   Take all medicines, including antibiotics, as directed. Take the antibiotics for the full prescribed length of time even if you are feeling better.   It is very important to keep all follow-up appointments.  SEEK IMMEDIATE MEDICAL CARE IF:   You have a fever.   You have pain not relieved with medicines.   You have any worsening of your problems.   Your pain seems to come and go.   You develop pain, redness, and swelling in the scrotum and surrounding areas.  MAKE SURE YOU:   Understand these instructions.   Will watch your condition.   Will get help right away if you are not doing well or get worse.  Document Released: 05/12/2000 Document Revised: 05/04/2011 Document Reviewed: 04/01/2009 [facility] Patient Information 2012 [place],  [place].  You can take  Percocet as directed for pain. Call Dr. Patsi Sears on 10/02/2011 to schedule appointment for within one week. You should get your blood pressure rechecked within the next one or 2 weeks . Tonight's is elevated at 168/96. [facility] to get your blood pressure rechecked

## 2011-09-29 NOTE — ED Notes (Signed)
MD at bedside. 

## 2012-01-19 ENCOUNTER — Emergency Department (HOSPITAL_COMMUNITY)
Admission: EM | Admit: 2012-01-19 | Discharge: 2012-01-19 | Disposition: A | Discharge status: Home / Self Care | Payer: Self-pay | Attending: Emergency Medicine | Admitting: Emergency Medicine

## 2012-01-19 ENCOUNTER — Encounter (HOSPITAL_COMMUNITY): Payer: Self-pay | Admitting: Emergency Medicine

## 2012-01-19 DIAGNOSIS — H6690 Otitis media, unspecified, unspecified ear: Secondary | ICD-10-CM | POA: Insufficient documentation

## 2012-01-19 DIAGNOSIS — F17200 Nicotine dependence, unspecified, uncomplicated: Secondary | ICD-10-CM | POA: Insufficient documentation

## 2012-01-19 DIAGNOSIS — J45909 Unspecified asthma, uncomplicated: Secondary | ICD-10-CM | POA: Insufficient documentation

## 2012-01-19 HISTORY — DX: Unspecified asthma, uncomplicated: J45.909

## 2012-01-19 MED ORDER — ANTIPYRINE-BENZOCAINE 5.4-1.4 % OT SOLN
3.0000 [drp] | OTIC | Status: DC | PRN
  Administered 2012-01-19: 3 [drp] via OTIC
  Filled 2012-01-19: qty 10

## 2012-01-19 MED ORDER — PENICILLIN V POTASSIUM 500 MG PO TABS: 500.0000 mg | ORAL_TABLET | Freq: Three times a day (TID) | ORAL | Status: AC

## 2012-01-19 NOTE — ED Notes (Signed)
Pt reports having bilateral ear pain and toothache; dental pain has been hurting for 3 weeks, ear wax noted in both ears

## 2012-01-19 NOTE — ED Provider Notes (Signed)
History     CSN: [id number]  Arrival date & time 01/19/12  2001   None     Chief Complaint  Patient presents with  . Otalgia    (Consider location/radiation/quality/duration/timing/severity/associated sxs/prior treatment) HPI  She presents to the emergency department for bilateral ear pain. He states that the pain has been fine for 3 weeks by recently gotten a lot worse. He denies any fevers. He denies having sore throat, cough,, fevers. He did admit that he had dental pain for which he is trying to get in to see a dentist for. States that he has had a urine infection in very long time but this feels a lot like that. Vital signs are stable and the patient is in no acute distress.  Past Medical History  Diagnosis Date  . Morbid obesity   . Asthma     Past Surgical History  Procedure Date  . Cholecystectomy   . Wrist surgery     History reviewed. No pertinent family history.  History  Substance Use Topics  . Smoking status: Current Everyday Smoker -- 0.2 packs/day  . Smokeless tobacco: Never Used  . Alcohol Use: Yes     socially      Review of Systems  All other systems reviewed and are negative.    Allergies  Review of patient's allergies indicates no known allergies.  Home Medications   Current Outpatient Rx  Name Route Sig Dispense Refill  . PENICILLIN V POTASSIUM 500 MG PO TABS Oral Take 1 tablet (500 mg total) by mouth 3 (three) times daily. 30 tablet 0    BP 151/68  Pulse 85  Temp 97.8 F (36.6 C) (Oral)  Resp 22  SpO2 98%  Physical Exam  Nursing note and vitals reviewed. Constitutional: He appears well-developed and well-nourished. No distress.  HENT:  Head: Normocephalic and atraumatic.  Right Ear: Tympanic membrane is injected. Tympanic membrane is not retracted and not bulging.  Left Ear: Tympanic membrane is not injected, not retracted and not bulging.  Eyes: Pupils are equal, round, and reactive to light.  Neck: Normal range of  motion. Neck supple.  Cardiovascular: Normal rate and regular rhythm.   Pulmonary/Chest: Effort normal.  Abdominal: Soft.  Neurological: He is alert.  Skin: Skin is warm and dry.    ED Course  Procedures (including critical care time)  Labs Reviewed - No data to display No results found.   1. Otitis media       MDM  Patient given Auralgan ear drops in the ER and also a prescription for penicillin to take dental pain and ear infection. Patient has been advised to followup with his primary care doctor.  Pt has been advised of the symptoms that warrant their return to the ED. Patient has voiced understanding and has agreed to follow-up with the PCP or specialist.         Dorthula Matas, PA 01/19/12 2344

## 2012-01-24 NOTE — ED Provider Notes (Signed)
Medical screening examination/treatment/procedure(s) were performed by non-physician practitioner and as supervising physician I was immediately available for consultation/collaboration.  Tiare Rohlman, MD 01/24/12 1513 

## 2012-03-20 ENCOUNTER — Emergency Department (HOSPITAL_COMMUNITY)
Admission: EM | Admit: 2012-03-20 | Discharge: 2012-03-21 | Disposition: A | Discharge status: Home / Self Care | Payer: Self-pay | Attending: Emergency Medicine | Admitting: Emergency Medicine

## 2012-03-20 ENCOUNTER — Encounter (HOSPITAL_COMMUNITY): Payer: Self-pay | Admitting: Family Medicine

## 2012-03-20 DIAGNOSIS — H60399 Other infective otitis externa, unspecified ear: Secondary | ICD-10-CM | POA: Insufficient documentation

## 2012-03-20 DIAGNOSIS — Z87891 Personal history of nicotine dependence: Secondary | ICD-10-CM | POA: Insufficient documentation

## 2012-03-20 DIAGNOSIS — J45909 Unspecified asthma, uncomplicated: Secondary | ICD-10-CM | POA: Insufficient documentation

## 2012-03-20 DIAGNOSIS — H6090 Unspecified otitis externa, unspecified ear: Secondary | ICD-10-CM

## 2012-03-20 DIAGNOSIS — H6690 Otitis media, unspecified, unspecified ear: Secondary | ICD-10-CM | POA: Insufficient documentation

## 2012-03-20 NOTE — ED Notes (Addendum)
Patient was seen at [facility] last month for ear infection. Was given antibiotic and finished. Has been using ear drops for ear pain without relief. Also reports that he has a cavity and using Orajel without pain relief. Left TM erythematous. Lower left rear molar broken.

## 2012-03-21 MED ORDER — OXYCODONE-ACETAMINOPHEN 5-325 MG PO TABS: 2.0000 | ORAL_TABLET | ORAL | Status: DC | PRN

## 2012-03-21 MED ORDER — AMOXICILLIN-POT CLAVULANATE 875-125 MG PO TABS: 1.0000 | ORAL_TABLET | Freq: Two times a day (BID) | ORAL | Status: DC

## 2012-03-21 MED ORDER — OXYCODONE-ACETAMINOPHEN 5-325 MG PO TABS
2.0000 | ORAL_TABLET | Freq: Once | ORAL | Status: AC
  Administered 2012-03-21: 2 via ORAL
  Filled 2012-03-21: qty 2

## 2012-03-21 MED ORDER — CIPROFLOXACIN-DEXAMETHASONE 0.3-0.1 % OT SUSP
4.0000 [drp] | Freq: Once | OTIC | Status: AC
  Administered 2012-03-21: 4 [drp] via OTIC
  Filled 2012-03-21: qty 7.5

## 2012-03-21 NOTE — ED Provider Notes (Signed)
History     CSN: [id number]  Arrival date & time 03/20/12  2254   First MD Initiated Contact with Patient 03/21/12 0012      Chief Complaint  Patient presents with  . Otalgia    (Consider location/radiation/quality/duration/timing/severity/associated sxs/prior treatment) HPI Comments: Patient is 31 year old male who presents with ear pain for the past month. The pain is located in both ears and is described as sharp and severe. The pain started gradually and has been constant for the past month since the onset. He was diagnosed with an ear infection one month ago and given ear drops. He used the ear drops without improvement. No aggravating/alleviating factors. Patient denies headache, fever, NVD, sore throat, congestion, cough, chest pain, SOB, abdominal pain.    Past Medical History  Diagnosis Date  . Morbid obesity   . Asthma     Past Surgical History  Procedure Date  . Cholecystectomy   . Wrist surgery   . Wrist surgery     No family history on file.  History  Substance Use Topics  . Smoking status: Current Every Day Smoker -- 0.2 packs/day  . Smokeless tobacco: Never Used  . Alcohol Use: Yes     socially      Review of Systems  HENT: Positive for ear pain.   All other systems reviewed and are negative.    Allergies  Review of patient's allergies indicates no known allergies.  Home Medications   Current Outpatient Rx  Name Route Sig Dispense Refill  . CYCLOBENZAPRINE HCL 5 MG PO TABS Oral Take 5 mg by mouth 3 (three) times daily as needed. Muscle spasm    . TRAMADOL HCL 50 MG PO TABS Oral Take 50 mg by mouth every 6 (six) hours as needed. pain    . AMOXICILLIN-POT CLAVULANATE 875-125 MG PO TABS Oral Take 1 tablet by mouth every 12 (twelve) hours. 14 tablet 0  . OXYCODONE-ACETAMINOPHEN 5-325 MG PO TABS Oral Take 2 tablets by mouth every 4 (four) hours as needed for pain. 6 tablet 0    BP 131/67  Pulse 90  Temp 98.5 F (36.9 C)  Resp 21  SpO2  98%  Physical Exam  Nursing note and vitals reviewed. Constitutional: He is oriented to person, place, and time. He appears well-developed and well-nourished. No distress.       Patient appears uncomfortable.   HENT:  Head: Normocephalic and atraumatic.  Right Ear: External ear normal.  Left Ear: External ear normal.  Nose: Nose normal.  Mouth/Throat: Oropharynx is clear and moist. No oropharyngeal exudate.       Pain with retraction of auricles bilaterally. Bilateral erythematous external canals. No mastoid process tenderness to palpation. Clear discharge from ears bilaterally.   Eyes: Conjunctivae normal and EOM are normal. Pupils are equal, round, and reactive to light. Right eye exhibits no discharge. Left eye exhibits no discharge. No scleral icterus.  Neck: Normal range of motion. Neck supple.  Cardiovascular: Normal rate and regular rhythm.  Exam reveals no gallop and no friction rub.   No murmur heard. Pulmonary/Chest: Effort normal and breath sounds normal. He has no wheezes. He has no rales. He exhibits no tenderness.  Abdominal: Soft. He exhibits no distension.  Musculoskeletal: Normal range of motion.  Lymphadenopathy:    He has no cervical adenopathy.  Neurological: He is alert and oriented to person, place, and time. Coordination normal.       Speech is goal-oriented. Moves limbs without ataxia.  Skin: Skin is warm and dry. He is not diaphoretic.  Psychiatric: He has a normal mood and affect. His behavior is normal.    ED Course  Procedures (including critical care time)  Labs Reviewed - No data to display No results found.   1. Otitis externa   2. Otitis media       MDM  12:40 AM Patient likely has otitis media and externa. I will give him Percocet for pain. He is afebrile. He does not have diabetes. I will treat him with Augmentin and antibiotic ear drops. Patient should return to the ED with worsening or concerning symptoms.         Emilia Beck, PA-C 03/22/12 1654

## 2012-03-24 NOTE — ED Provider Notes (Signed)
Medical screening examination/treatment/procedure(s) were performed by non-physician practitioner and as supervising physician I was immediately available for consultation/collaboration.   Hanley Seamen, MD 03/24/12 2252

## 2012-04-15 ENCOUNTER — Encounter (HOSPITAL_COMMUNITY): Payer: Self-pay | Admitting: *Deleted

## 2012-04-15 ENCOUNTER — Emergency Department (HOSPITAL_COMMUNITY)
Admission: EM | Admit: 2012-04-15 | Discharge: 2012-04-15 | Discharge status: Against Medical Advice | Payer: Self-pay | Attending: Emergency Medicine | Admitting: Emergency Medicine

## 2012-04-15 DIAGNOSIS — M549 Dorsalgia, unspecified: Secondary | ICD-10-CM | POA: Insufficient documentation

## 2012-04-15 HISTORY — DX: Dorsalgia, unspecified: M54.9

## 2012-04-15 NOTE — ED Notes (Signed)
Pt is here with lower back pain that started a couple of weeks ago.  Pt has had lower back pain before and it was related to his weight.  Ambulatory.  Pt states sometimes he does not have time to get to urinate, states sometimes he know he has to urinate but sometimes does not.  No bowel incontinence

## 2012-04-15 NOTE — ED Notes (Signed)
The pt states he is not waiting any longer he has to give someone a ride home

## 2012-05-29 DIAGNOSIS — K5732 Diverticulitis of large intestine without perforation or abscess without bleeding: Secondary | ICD-10-CM

## 2012-05-29 HISTORY — DX: Diverticulitis of large intestine without perforation or abscess without bleeding: K57.32

## 2012-05-29 HISTORY — PX: GASTRIC BYPASS: SHX52

## 2012-06-13 ENCOUNTER — Encounter (HOSPITAL_COMMUNITY): Payer: Self-pay | Admitting: Emergency Medicine

## 2012-06-13 ENCOUNTER — Emergency Department (HOSPITAL_COMMUNITY): Payer: Self-pay

## 2012-06-13 ENCOUNTER — Emergency Department (HOSPITAL_COMMUNITY)
Admission: EM | Admit: 2012-06-13 | Discharge: 2012-06-13 | Disposition: A | Discharge status: Home / Self Care | Payer: Self-pay | Attending: Emergency Medicine | Admitting: Emergency Medicine

## 2012-06-13 DIAGNOSIS — J45909 Unspecified asthma, uncomplicated: Secondary | ICD-10-CM | POA: Insufficient documentation

## 2012-06-13 DIAGNOSIS — F17200 Nicotine dependence, unspecified, uncomplicated: Secondary | ICD-10-CM | POA: Insufficient documentation

## 2012-06-13 DIAGNOSIS — Z8739 Personal history of other diseases of the musculoskeletal system and connective tissue: Secondary | ICD-10-CM | POA: Insufficient documentation

## 2012-06-13 DIAGNOSIS — K5792 Diverticulitis of intestine, part unspecified, without perforation or abscess without bleeding: Secondary | ICD-10-CM

## 2012-06-13 DIAGNOSIS — R1032 Left lower quadrant pain: Secondary | ICD-10-CM | POA: Insufficient documentation

## 2012-06-13 DIAGNOSIS — K5732 Diverticulitis of large intestine without perforation or abscess without bleeding: Secondary | ICD-10-CM | POA: Insufficient documentation

## 2012-06-13 LAB — CBC WITH DIFFERENTIAL/PLATELET
Eosinophils Absolute: 0.2 10*3/uL (ref 0.0–0.7)
Eosinophils Relative: 2 % (ref 0–5)
Hemoglobin: 12.9 g/dL — ABNORMAL LOW (ref 13.0–17.0)
Lymphs Abs: 2 10*3/uL (ref 0.7–4.0)
MCH: 26.6 pg (ref 26.0–34.0)
MCV: 80.4 fL (ref 78.0–100.0)
Monocytes Relative: 9 % (ref 3–12)
Neutrophils Relative %: 71 % (ref 43–77)
RBC: 4.85 MIL/uL (ref 4.22–5.81)

## 2012-06-13 LAB — BASIC METABOLIC PANEL
BUN: 11 mg/dL (ref 6–23)
Chloride: 101 mEq/L (ref 96–112)
Glucose, Bld: 97 mg/dL (ref 70–99)
Potassium: 4.2 mEq/L (ref 3.5–5.1)

## 2012-06-13 LAB — URINALYSIS, ROUTINE W REFLEX MICROSCOPIC
Bilirubin Urine: NEGATIVE
Glucose, UA: NEGATIVE mg/dL
Hgb urine dipstick: NEGATIVE
Specific Gravity, Urine: 1.018 (ref 1.005–1.030)
pH: 7.5 (ref 5.0–8.0)

## 2012-06-13 MED ORDER — METRONIDAZOLE 500 MG PO TABS: 500.0000 mg | ORAL_TABLET | Freq: Three times a day (TID) | ORAL | Status: DC

## 2012-06-13 MED ORDER — HYDROMORPHONE HCL PF 1 MG/ML IJ SOLN
1.0000 mg | Freq: Once | INTRAMUSCULAR | Status: AC
  Administered 2012-06-13: 1 mg via INTRAVENOUS
  Filled 2012-06-13: qty 1

## 2012-06-13 MED ORDER — ONDANSETRON HCL 4 MG/2ML IJ SOLN
4.0000 mg | Freq: Once | INTRAMUSCULAR | Status: AC
  Administered 2012-06-13: 4 mg via INTRAVENOUS
  Filled 2012-06-13: qty 2

## 2012-06-13 MED ORDER — CIPROFLOXACIN HCL 500 MG PO TABS: 500.0000 mg | ORAL_TABLET | Freq: Two times a day (BID) | ORAL | Status: DC

## 2012-06-13 MED ORDER — METOCLOPRAMIDE HCL 10 MG PO TABS: 10.0000 mg | ORAL_TABLET | Freq: Four times a day (QID) | ORAL | Status: DC | PRN

## 2012-06-13 MED ORDER — CIPROFLOXACIN HCL 500 MG PO TABS
500.0000 mg | ORAL_TABLET | Freq: Once | ORAL | Status: AC
  Administered 2012-06-13: 500 mg via ORAL
  Filled 2012-06-13: qty 1

## 2012-06-13 MED ORDER — METRONIDAZOLE 500 MG PO TABS
500.0000 mg | ORAL_TABLET | Freq: Once | ORAL | Status: AC
  Administered 2012-06-13: 500 mg via ORAL
  Filled 2012-06-13: qty 1

## 2012-06-13 MED ORDER — OXYCODONE-ACETAMINOPHEN 5-325 MG PO TABS: 1.0000 | ORAL_TABLET | ORAL | Status: DC | PRN

## 2012-06-13 MED ORDER — IOHEXOL 300 MG/ML  SOLN
100.0000 mL | Freq: Once | INTRAMUSCULAR | Status: AC | PRN
  Administered 2012-06-13: 100 mL via INTRAVENOUS

## 2012-06-13 NOTE — ED Notes (Signed)
Patient is alert and oriented x3.  He was given DC instructions and follow up visit instructions.  Patient gave verbal understanding.  He was DC ambulatory under his own power to home.  V/S stable.  He was not showing any signs of distress on DC 

## 2012-06-13 NOTE — ED Notes (Signed)
Per patient, has been worked up for same symptoms months ago-negative eval-increased left groin/testicle pain last night

## 2012-06-13 NOTE — ED Provider Notes (Signed)
History     CSN: [id number]  Arrival date & time 06/13/12  1442   First MD Initiated Contact with Patient 06/13/12 1523      Chief Complaint  Patient presents with  . Testicle Pain    (Consider location/radiation/quality/duration/timing/severity/associated sxs/prior treatment) Patient is a 32 y.o. male presenting with testicular pain. The history is provided by the patient.  Testicle Pain  He had onset last night of pain in his left testicle which radiated up to the left lower abdomen. Pain is dull and severe and he rates it at 10/10. Has been getting worse over time. There is associated nausea but no vomiting. Denies any dysuria. He denies constipation or diarrhea. Denies any trauma. Pain is worse if he lays down and better if he sits up. He had a similar episode in the past which hospital record shows was treated as epididymitis.  Past Medical History  Diagnosis Date  . Morbid obesity   . Asthma   . Back pain     Past Surgical History  Procedure Date  . Cholecystectomy   . Wrist surgery   . Wrist surgery     No family history on file.  History  Substance Use Topics  . Smoking status: Current Every Day Smoker -- 0.2 packs/day  . Smokeless tobacco: Never Used  . Alcohol Use: Yes     Comment: socially      Review of Systems  Genitourinary: Positive for testicular pain.  All other systems reviewed and are negative.    Allergies  Review of patient's allergies indicates no known allergies.  Home Medications  No current outpatient prescriptions on file.  BP 132/56  Pulse 77  Temp 98.6 F (37 C) (Oral)  Resp 18  SpO2 100%  Physical Exam  Nursing note and vitals reviewed.  Morbidly obese 32 year old male, resting comfortably and in no acute distress. Vital signs are normal. Oxygen saturation is 100%, which is normal. Head is normocephalic and atraumatic. PERRLA, EOMI. Oropharynx is clear. Neck is nontender and supple without adenopathy or JVD. Back is  nontender and there is no CVA tenderness. Lungs are clear without rales, wheezes, or rhonchi. Chest is nontender. Heart has regular rate and rhythm without murmur. Abdomen is obese. There is mild right lower quadrant tenderness and severe left lower quadrant tenderness. However, palpation in this area is difficult because of the abdominal pannus folds over the thigh and and it is very difficult to tell which you are palpating. The remainder of the abdomen is nontender. Genitalia: Uncircumcised penis without phimosis or paraphimosis. Testes are descended and normal size. There is tenderness palpation of the left testicle and a marked tenderness with palpation in the left inguinal canal. No definite hernia is palpated and no masses palpated. Extremities have no cyanosis or edema, full range of motion is present. Skin is warm and dry without rash. Neurologic: Mental status is normal, cranial nerves are intact, there are no motor or sensory deficits.  ED Course  Procedures (including critical care time)  Results for orders placed during the hospital encounter of 06/13/12  BASIC METABOLIC PANEL      Component Value Range   Sodium 136  135 - 145 mEq/L   Potassium 4.2  3.5 - 5.1 mEq/L   Chloride 101  96 - 112 mEq/L   CO2 27  19 - 32 mEq/L   Glucose, Bld 97  70 - 99 mg/dL   BUN 11  6 - 23 mg/dL   Creatinine,  Ser 0.83  0.50 - 1.35 mg/dL   Calcium 9.2  8.4 - 40.9 mg/dL   GFR calc non Af Amer >90  >90 mL/min   GFR calc Af Amer >90  >90 mL/min  URINALYSIS, ROUTINE W REFLEX MICROSCOPIC      Component Value Range   Color, Urine YELLOW  YELLOW   APPearance CLEAR  CLEAR   Specific Gravity, Urine 1.018  1.005 - 1.030   pH 7.5  5.0 - 8.0   Glucose, UA NEGATIVE  NEGATIVE mg/dL   Hgb urine dipstick NEGATIVE  NEGATIVE   Bilirubin Urine NEGATIVE  NEGATIVE   Ketones, ur NEGATIVE  NEGATIVE mg/dL   Protein, ur NEGATIVE  NEGATIVE mg/dL   Urobilinogen, UA 0.2  0.0 - 1.0 mg/dL   Nitrite NEGATIVE  NEGATIVE     Leukocytes, UA NEGATIVE  NEGATIVE  CBC WITH DIFFERENTIAL      Component Value Range   WBC 11.3 (*) 4.0 - 10.5 K/uL   RBC 4.85  4.22 - 5.81 MIL/uL   Hemoglobin 12.9 (*) 13.0 - 17.0 g/dL   HCT 81.1  91.4 - 78.2 %   MCV 80.4  78.0 - 100.0 fL   MCH 26.6  26.0 - 34.0 pg   MCHC 33.1  30.0 - 36.0 g/dL   RDW 95.6 (*) 21.3 - 08.6 %   Platelets 277  150 - 400 K/uL   Neutrophils Relative 71  43 - 77 %   Neutro Abs 8.1 (*) 1.7 - 7.7 K/uL   Lymphocytes Relative 17  12 - 46 %   Lymphs Abs 2.0  0.7 - 4.0 K/uL   Monocytes Relative 9  3 - 12 %   Monocytes Absolute 1.0  0.1 - 1.0 K/uL   Eosinophils Relative 2  0 - 5 %   Eosinophils Absolute 0.2  0.0 - 0.7 K/uL   Basophils Relative 0  0 - 1 %   Basophils Absolute 0.0  0.0 - 0.1 K/uL   US Scrotum  06/13/2012  *RADIOLOGY REPORT*  Clinical Data:  Testicular pain.  SCROTAL ULTRASOUND DOPPLER ULTRASOUND OF THE TESTICLES  Technique: Complete ultrasound examination of the testicles, epididymis, and other scrotal structures was performed.  Color and spectral Doppler ultrasound were also utilized to evaluate blood flow to the testicles.  Comparison:  Scrotal ultrasound 09/29/2011.  Findings:  Right testis:  The right testis is of normal size and echotexture measuring 4.2 x 2.4 x 3.2 cm.  Normal color Doppler signal is evident.  Left testis:  The left testis is of normal size and echotexture measuring 4.1 x 2.3 x 3.7 cm.  Normal color Doppler signal is present.  Right epididymis:  Normal in size and appearance.  Left epididymis:  Normal in size and appearance.  Hydrocele:  Absent  Varicocele:  A left sided varicocele is present with veins measuring up to 3.8 mm.  Pulsed Doppler interrogation of both testes demonstrates low resistance flow bilaterally.  IMPRESSION:  1.  A left sided varicocele is now present. 2.  Normal appearance of the testicles bilaterally. 3.  Normal color and spectral Doppler analysis of the testicles.   Original Report Authenticated By:  Marin Roberts, M.D.    Ct Abdomen Pelvis W Contrast  06/13/2012  *RADIOLOGY REPORT*  Clinical Data: Left testicular pain.  CT ABDOMEN AND PELVIS WITH CONTRAST  Technique:  Multidetector CT imaging of the abdomen and pelvis was performed following the standard protocol during bolus administration of intravenous contrast.  Contrast: OMNIPAQUE IOHEXOL  300 MG/ML  SOLN  Comparison: CT of the abdomen and pelvis 01/09/2010.  Findings:  Lung Bases: Unremarkable.  Abdomen/Pelvis:  Status post cholecystectomy.  The appearance of the liver, pancreas, spleen, bilateral adrenal glands and bilateral kidneys is unremarkable.  There are a few scattered colonic diverticula, and adjacent to the distal descending colon and proximal sigmoid colon there are some inflammatory changes suspicious for acute diverticulitis.  No definite diverticular abscess is identified at this time.  No pneumoperitoneum.  No significant volume of ascites.  No pathologic distension of bowel. No definite pathologic lymphadenopathy identified within the abdomen or pelvis.  Urinary bladder is unremarkable in appearance.  Musculoskeletal: There are no aggressive appearing lytic or blastic lesions noted in the visualized portions of the skeleton. 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  IMPRESSION: 1.  Acute diverticulitis of the distal descending and proximal sigmoid colon.  No definite diverticular abscess or findings to suggest perforation at this time. 2.  Status post cholecystectomy.  These results were called by telephone on 06/13/2012 at 06:23 p.m. to Dr. Preston Fleeting, who verbally acknowledged these results.   Original Report Authenticated By: Trudie Reed, M.D.    [place] Art/ven Flow Abd Pelv Doppler  06/13/2012  *RADIOLOGY REPORT*  Clinical Data:  Testicular pain.  SCROTAL ULTRASOUND DOPPLER ULTRASOUND OF THE TESTICLES  Technique: Complete ultrasound examination of the testicles, epididymis, and other  scrotal structures was performed.  Color and spectral Doppler ultrasound were also utilized to evaluate blood flow to the testicles.  Comparison:  Scrotal ultrasound 09/29/2011.  Findings:  Right testis:  The right testis is of normal size and echotexture measuring 4.2 x 2.4 x 3.2 cm.  Normal color Doppler signal is evident.  Left testis:  The left testis is of normal size and echotexture measuring 4.1 x 2.3 x 3.7 cm.  Normal color Doppler signal is present.  Right epididymis:  Normal in size and appearance.  Left epididymis:  Normal in size and appearance.  Hydrocele:  Absent  Varicocele:  A left sided varicocele is present with veins measuring up to 3.8 mm.  Pulsed Doppler interrogation of both testes demonstrates low resistance flow bilaterally.  IMPRESSION:  1.  A left sided varicocele is now present. 2.  Normal appearance of the testicles bilaterally. 3.  Normal color and spectral Doppler analysis of the testicles.   Original Report Authenticated By: Marin Roberts, M.D.       1. Diverticulitis   2. Morbid obesity       MDM  Left scrotal and left lower abdominal pain of uncertain cause. His physical habitus makes exam very difficult. Old records are reviewed and he was treated for epididymitis last night. Ultrasound will be obtained of the scrotum and a CT scan obtained of the abdomen.  CT shows diverticulitis and scrotal pain is apparently referred pain. Varicocele is unlikely to be related to his pain. He got good relief of pain with hydromorphone and there is no evidence of complications which would require inpatient treatment. He is discharged with prescriptions for Cipro ofloxacin, metronidazole, metoclopramide, and Percocet.      Dione Booze, MD 06/13/12 [phone]

## 2012-07-04 ENCOUNTER — Observation Stay (HOSPITAL_COMMUNITY): Payer: BC Managed Care – PPO | Admitting: Anesthesiology

## 2012-07-04 ENCOUNTER — Encounter (HOSPITAL_COMMUNITY)
Admission: EM | Disposition: A | Discharge status: Home / Self Care | Payer: Self-pay | Source: Home / Self Care | Attending: Emergency Medicine

## 2012-07-04 ENCOUNTER — Observation Stay (HOSPITAL_COMMUNITY)
Admission: EM | Admit: 2012-07-04 | Discharge: 2012-07-05 | Disposition: A | Discharge status: Home / Self Care | Payer: BC Managed Care – PPO | Attending: General Surgery | Admitting: General Surgery

## 2012-07-04 ENCOUNTER — Emergency Department (HOSPITAL_COMMUNITY): Payer: BC Managed Care – PPO

## 2012-07-04 ENCOUNTER — Encounter (HOSPITAL_COMMUNITY): Payer: Self-pay | Admitting: Emergency Medicine

## 2012-07-04 ENCOUNTER — Encounter (HOSPITAL_COMMUNITY): Payer: Self-pay | Admitting: Anesthesiology

## 2012-07-04 DIAGNOSIS — K648 Other hemorrhoids: Principal | ICD-10-CM | POA: Diagnosis present

## 2012-07-04 DIAGNOSIS — Z8719 Personal history of other diseases of the digestive system: Secondary | ICD-10-CM

## 2012-07-04 DIAGNOSIS — I861 Scrotal varices: Secondary | ICD-10-CM | POA: Diagnosis present

## 2012-07-04 DIAGNOSIS — F17200 Nicotine dependence, unspecified, uncomplicated: Secondary | ICD-10-CM | POA: Insufficient documentation

## 2012-07-04 DIAGNOSIS — K649 Unspecified hemorrhoids: Secondary | ICD-10-CM

## 2012-07-04 HISTORY — DX: Diverticulitis of large intestine without perforation or abscess without bleeding: K57.32

## 2012-07-04 HISTORY — DX: Morbid (severe) obesity due to excess calories: E66.01

## 2012-07-04 LAB — BASIC METABOLIC PANEL
BUN: 13 mg/dL (ref 6–23)
CO2: 25 mEq/L (ref 19–32)
Calcium: 9 mg/dL (ref 8.4–10.5)
Creatinine, Ser: 0.79 mg/dL (ref 0.50–1.35)

## 2012-07-04 LAB — CBC
MCH: 26.1 pg (ref 26.0–34.0)
MCV: 80.5 fL (ref 78.0–100.0)
Platelets: 327 10*3/uL (ref 150–400)
RBC: 5.28 MIL/uL (ref 4.22–5.81)

## 2012-07-04 SURGERY — EXAM UNDER ANESTHESIA, RECTUM
Anesthesia: General | Site: Rectum | Wound class: Contaminated

## 2012-07-04 MED ORDER — FENTANYL CITRATE 0.05 MG/ML IJ SOLN
INTRAMUSCULAR | Status: AC
  Filled 2012-07-04: qty 2

## 2012-07-04 MED ORDER — MENTHOL 3 MG MT LOZG: 1.0000 | LOZENGE | OROMUCOSAL | Status: DC | PRN

## 2012-07-04 MED ORDER — KETOROLAC TROMETHAMINE 30 MG/ML IJ SOLN
INTRAMUSCULAR | Status: DC | PRN
  Administered 2012-07-04: 30 mg via INTRAVENOUS

## 2012-07-04 MED ORDER — ACETAMINOPHEN 650 MG RE SUPP: 650.0000 mg | Freq: Four times a day (QID) | RECTAL | Status: DC | PRN

## 2012-07-04 MED ORDER — FENTANYL CITRATE 0.05 MG/ML IJ SOLN
INTRAMUSCULAR | Status: DC | PRN
  Administered 2012-07-04 (×3): 100 ug via INTRAVENOUS
  Administered 2012-07-04: 150 ug via INTRAVENOUS

## 2012-07-04 MED ORDER — FENTANYL CITRATE 0.05 MG/ML IJ SOLN
25.0000 ug | INTRAMUSCULAR | Status: DC | PRN
  Administered 2012-07-04 (×3): 50 ug via INTRAVENOUS

## 2012-07-04 MED ORDER — BUPIVACAINE LIPOSOME 1.3 % IJ SUSP
INTRAMUSCULAR | Status: DC | PRN
  Administered 2012-07-04: 21:00:00

## 2012-07-04 MED ORDER — IOHEXOL 300 MG/ML  SOLN
100.0000 mL | Freq: Once | INTRAMUSCULAR | Status: AC | PRN
  Administered 2012-07-04: 100 mL via INTRAVENOUS

## 2012-07-04 MED ORDER — METOPROLOL TARTRATE 12.5 MG HALF TABLET
12.5000 mg | ORAL_TABLET | Freq: Two times a day (BID) | ORAL | Status: DC | PRN
  Filled 2012-07-04: qty 1

## 2012-07-04 MED ORDER — OXYCODONE HCL 5 MG PO TABS: 5.0000 mg | ORAL_TABLET | ORAL | Status: DC | PRN

## 2012-07-04 MED ORDER — LACTATED RINGERS IV BOLUS (SEPSIS): 1000.0000 mL | Freq: Three times a day (TID) | INTRAVENOUS | Status: DC | PRN

## 2012-07-04 MED ORDER — NAPROXEN 500 MG PO TABS
500.0000 mg | ORAL_TABLET | Freq: Two times a day (BID) | ORAL | Status: DC
  Administered 2012-07-05: 500 mg via ORAL
  Filled 2012-07-04 (×3): qty 1

## 2012-07-04 MED ORDER — HYDROMORPHONE HCL PF 1 MG/ML IJ SOLN
0.5000 mg | INTRAMUSCULAR | Status: DC | PRN
  Administered 2012-07-04 – 2012-07-05 (×2): 1 mg via INTRAVENOUS
  Filled 2012-07-04 (×2): qty 1

## 2012-07-04 MED ORDER — ACETAMINOPHEN 325 MG PO TABS: 650.0000 mg | ORAL_TABLET | Freq: Four times a day (QID) | ORAL | Status: DC | PRN

## 2012-07-04 MED ORDER — BUPIVACAINE-EPINEPHRINE 0.25% -1:200000 IJ SOLN
INTRAMUSCULAR | Status: AC
  Filled 2012-07-04: qty 1

## 2012-07-04 MED ORDER — DEXTROSE IN LACTATED RINGERS 5 % IV SOLN
INTRAVENOUS | Status: DC
  Administered 2012-07-04: 19:00:00 via INTRAVENOUS

## 2012-07-04 MED ORDER — PIPERACILLIN-TAZOBACTAM 3.375 G IVPB
3.3750 g | Freq: Three times a day (TID) | INTRAVENOUS | Status: DC
  Administered 2012-07-04 – 2012-07-05 (×3): 3.375 g via INTRAVENOUS
  Filled 2012-07-04 (×4): qty 50

## 2012-07-04 MED ORDER — DIPHENHYDRAMINE HCL 12.5 MG/5ML PO ELIX: 12.5000 mg | ORAL_SOLUTION | Freq: Four times a day (QID) | ORAL | Status: DC | PRN

## 2012-07-04 MED ORDER — LIDOCAINE HCL (CARDIAC) 20 MG/ML IV SOLN
INTRAVENOUS | Status: DC | PRN
  Administered 2012-07-04: 100 mg via INTRAVENOUS

## 2012-07-04 MED ORDER — MAGIC MOUTHWASH
15.0000 mL | Freq: Four times a day (QID) | ORAL | Status: DC | PRN
  Filled 2012-07-04: qty 15

## 2012-07-04 MED ORDER — LACTATED RINGERS IV SOLN
INTRAVENOUS | Status: DC | PRN
  Administered 2012-07-04: 20:00:00 via INTRAVENOUS

## 2012-07-04 MED ORDER — ONDANSETRON HCL 4 MG/2ML IJ SOLN
4.0000 mg | Freq: Once | INTRAMUSCULAR | Status: AC
  Administered 2012-07-04: 4 mg via INTRAVENOUS
  Filled 2012-07-04: qty 2

## 2012-07-04 MED ORDER — BUPIVACAINE LIPOSOME 1.3 % IJ SUSP
20.0000 mL | INTRAMUSCULAR | Status: DC
  Filled 2012-07-04: qty 20

## 2012-07-04 MED ORDER — WITCH HAZEL-GLYCERIN EX PADS
1.0000 "application " | MEDICATED_PAD | CUTANEOUS | Status: DC | PRN
  Filled 2012-07-04: qty 100

## 2012-07-04 MED ORDER — LACTATED RINGERS IV SOLN: INTRAVENOUS | Status: DC

## 2012-07-04 MED ORDER — HYDROCORTISONE ACE-PRAMOXINE 2.5-1 % RE CREA
1.0000 "application " | TOPICAL_CREAM | Freq: Four times a day (QID) | RECTAL | Status: DC
  Administered 2012-07-05 (×2): 1 via RECTAL
  Filled 2012-07-04: qty 30

## 2012-07-04 MED ORDER — 0.9 % SODIUM CHLORIDE (POUR BTL) OPTIME
TOPICAL | Status: DC | PRN
  Administered 2012-07-04: 1000 mL

## 2012-07-04 MED ORDER — ONDANSETRON HCL 4 MG/2ML IJ SOLN
4.0000 mg | Freq: Four times a day (QID) | INTRAMUSCULAR | Status: DC | PRN
  Administered 2012-07-05: 4 mg via INTRAVENOUS
  Filled 2012-07-04: qty 2

## 2012-07-04 MED ORDER — ONDANSETRON HCL 4 MG/2ML IJ SOLN
INTRAMUSCULAR | Status: DC | PRN
  Administered 2012-07-04: 4 mg via INTRAVENOUS

## 2012-07-04 MED ORDER — ALUM & MAG HYDROXIDE-SIMETH 200-200-20 MG/5ML PO SUSP: 30.0000 mL | Freq: Four times a day (QID) | ORAL | Status: DC | PRN

## 2012-07-04 MED ORDER — HYDROCORTISONE ACE-PRAMOXINE 2.5-1 % RE CREA: TOPICAL_CREAM | Freq: Four times a day (QID) | RECTAL | Status: DC

## 2012-07-04 MED ORDER — DIPHENHYDRAMINE HCL 50 MG/ML IJ SOLN: 12.5000 mg | Freq: Four times a day (QID) | INTRAMUSCULAR | Status: DC | PRN

## 2012-07-04 MED ORDER — SUCCINYLCHOLINE CHLORIDE 20 MG/ML IJ SOLN
INTRAMUSCULAR | Status: DC | PRN
  Administered 2012-07-04: 200 mg via INTRAVENOUS

## 2012-07-04 MED ORDER — LIP MEDEX EX OINT
1.0000 "application " | TOPICAL_OINTMENT | Freq: Two times a day (BID) | CUTANEOUS | Status: DC
  Administered 2012-07-05: 1 via TOPICAL
  Filled 2012-07-04: qty 7

## 2012-07-04 MED ORDER — OXYCODONE-ACETAMINOPHEN 5-325 MG PO TABS: ORAL_TABLET | ORAL | Status: DC

## 2012-07-04 MED ORDER — PROMETHAZINE HCL 25 MG/ML IJ SOLN
12.5000 mg | Freq: Four times a day (QID) | INTRAMUSCULAR | Status: DC | PRN
  Filled 2012-07-04: qty 1

## 2012-07-04 MED ORDER — HYDROMORPHONE HCL PF 1 MG/ML IJ SOLN
1.0000 mg | Freq: Once | INTRAMUSCULAR | Status: AC
  Administered 2012-07-04: 1 mg via INTRAVENOUS
  Filled 2012-07-04: qty 1

## 2012-07-04 MED ORDER — MAGNESIUM HYDROXIDE 400 MG/5ML PO SUSP: 30.0000 mL | Freq: Two times a day (BID) | ORAL | Status: DC | PRN

## 2012-07-04 MED ORDER — SODIUM CHLORIDE 0.9 % IV BOLUS (SEPSIS)
1000.0000 mL | Freq: Once | INTRAVENOUS | Status: AC
  Administered 2012-07-04: 1000 mL via INTRAVENOUS

## 2012-07-04 MED ORDER — OXYCODONE HCL 5 MG PO TABS
5.0000 mg | ORAL_TABLET | ORAL | Status: DC | PRN
  Administered 2012-07-05 (×3): 10 mg via ORAL
  Filled 2012-07-04 (×3): qty 2

## 2012-07-04 MED ORDER — PSYLLIUM 95 % PO PACK
1.0000 | PACK | Freq: Two times a day (BID) | ORAL | Status: DC
  Administered 2012-07-05: 1 via ORAL
  Filled 2012-07-04 (×3): qty 1

## 2012-07-04 MED ORDER — PHENOL 1.4 % MT LIQD: 2.0000 | OROMUCOSAL | Status: DC | PRN

## 2012-07-04 MED ORDER — HEPARIN SODIUM (PORCINE) 5000 UNIT/ML IJ SOLN
5000.0000 [IU] | Freq: Three times a day (TID) | INTRAMUSCULAR | Status: DC
  Administered 2012-07-05 (×2): 5000 [IU] via SUBCUTANEOUS
  Filled 2012-07-04 (×4): qty 1

## 2012-07-04 MED ORDER — PROPOFOL 10 MG/ML IV BOLUS
INTRAVENOUS | Status: DC | PRN
  Administered 2012-07-04: 100 mg via INTRAVENOUS
  Administered 2012-07-04: 300 mg via INTRAVENOUS

## 2012-07-04 SURGICAL SUPPLY — 39 items
BANDAGE GAUZE ELAST BULKY 4 IN (GAUZE/BANDAGES/DRESSINGS) IMPLANT
BLADE SURG 15 STRL LF DISP TIS (BLADE) ×2 IMPLANT
BLADE SURG 15 STRL SS (BLADE) ×3
CANISTER SUCTION 2500CC (MISCELLANEOUS) ×3 IMPLANT
CLOTH BEACON ORANGE TIMEOUT ST (SAFETY) ×3 IMPLANT
COVER SURGICAL LIGHT HANDLE (MISCELLANEOUS) ×3 IMPLANT
DECANTER SPIKE VIAL GLASS SM (MISCELLANEOUS) IMPLANT
DRAPE LAPAROSCOPIC ABDOMINAL (DRAPES) IMPLANT
DRSG PAD ABDOMINAL 8X10 ST (GAUZE/BANDAGES/DRESSINGS) ×2 IMPLANT
ELECT CAUTERY BLADE 6.4 (BLADE) ×1 IMPLANT
ELECT REM PT RETURN 9FT ADLT (ELECTROSURGICAL) ×3
ELECTRODE REM PT RTRN 9FT ADLT (ELECTROSURGICAL) ×2 IMPLANT
GLOVE BIO SURGEON STRL SZ7.5 (GLOVE) ×3 IMPLANT
GLOVE BIOGEL PI IND STRL 6 (GLOVE) ×1 IMPLANT
GLOVE BIOGEL PI INDICATOR 6 (GLOVE) ×1
GOWN STRL NON-REIN LRG LVL3 (GOWN DISPOSABLE) ×6 IMPLANT
HOVERMATT HALF SINGLE USE (PATIENT TRANSFER) ×2 IMPLANT
KIT BASIN OR (CUSTOM PROCEDURE TRAY) ×3 IMPLANT
NDL HYPO 25X1 1.5 SAFETY (NEEDLE) IMPLANT
NEEDLE HYPO 22GX1.5 SAFETY (NEEDLE) ×2 IMPLANT
NEEDLE HYPO 25X1 1.5 SAFETY (NEEDLE) ×3 IMPLANT
NS IRRIG 1000ML POUR BTL (IV SOLUTION) ×3 IMPLANT
PACK LITHOTOMY IV (CUSTOM PROCEDURE TRAY) ×2 IMPLANT
PENCIL BUTTON HOLSTER BLD 10FT (ELECTRODE) ×3 IMPLANT
SPONGE GAUZE 4X4 12PLY (GAUZE/BANDAGES/DRESSINGS) ×2 IMPLANT
SPONGE LAP 18X18 X RAY DECT (DISPOSABLE) ×1 IMPLANT
SUT MNCRL AB 4-0 PS2 18 (SUTURE) IMPLANT
SUT VIC AB 0 UR5 27 (SUTURE) ×10 IMPLANT
SUT VIC AB 3-0 SH 27 (SUTURE)
SUT VIC AB 3-0 SH 27XBRD (SUTURE) IMPLANT
SWAB COLLECTION DEVICE MRSA (MISCELLANEOUS) IMPLANT
SYR BULB 3OZ (MISCELLANEOUS) ×1 IMPLANT
SYR CONTROL 10ML LL (SYRINGE) ×2 IMPLANT
SYRINGE 10CC LL (SYRINGE) ×2 IMPLANT
TAPE CLOTH SURG 6X10 WHT LF (GAUZE/BANDAGES/DRESSINGS) ×4 IMPLANT
TOWEL OR 17X26 10 PK STRL BLUE (TOWEL DISPOSABLE) ×5 IMPLANT
TUBE ANAEROBIC SPECIMEN COL (MISCELLANEOUS) IMPLANT
WATER STERILE IRR 1000ML POUR (IV SOLUTION) IMPLANT
YANKAUER SUCT BULB TIP NO VENT (SUCTIONS) ×3 IMPLANT

## 2012-07-04 NOTE — Op Note (Signed)
07/04/2012  9:12 PM  PATIENT:  Mark Luna  32 y.o. male  Patient Care Team: Provider Default, MD as PCP - General  PRE-OPERATIVE DIAGNOSIS:    Rectal Pain with mass ?Abscess vs thrombosed hemorrhoid  POST-OPERATIVE DIAGNOSIS:   Inflamed hemorrhoids  PROCEDURE:  Procedure(s): RECTAL EXAM UNDER ANESTHESIA Hemorrhoidal ligation and sutured pexy x 6  SURGEON:  Surgeon(s): Ardeth Sportsman, MD  ASSISTANT: RN   ANESTHESIA:   local and general  EBL:     Delay start of Pharmacological VTE agent (>24hrs) due to surgical blood loss or risk of bleeding:  no  DRAINS: none   SPECIMEN:  No Specimen  DISPOSITION OF SPECIMEN:  N/A  COUNTS:  YES  PLAN OF CARE: Admit for overnight observation  PATIENT DISPOSITION:  PACU - hemodynamically stable.  INDICATION: Supraobese morbidly obese male with one-week history of a severe or rectal pain with defecation.  Her labs a mass possibly felt.  Pain became more severe.  Came emergency room.  Cannot tolerate rectal examination.  No fissure.  Based on severe pain and inadequacy of diagnosis by physical exam, I recommended examination under anesthesia for drainage of possible abscess vs. Surgery of inflammed/thrombosed hemorrhoid.    The anatomy & physiology of the anorectal region was discussed.  The pathophysiology of hemorrhoids and differential diagnosis was discussed.  Natural history risks without surgery was discussed.   I stressed the importance of a bowel regimen to have daily soft bowel movements to minimize progression of disease.  Interventions such as sclerotherapy & banding were discussed.  The patient's symptoms are not adequately controlled by medicines and other non-operative treatments.  I feel the risks & problems of no surgery outweigh the operative risks; therefore, I recommended surgery to treat the hemorrhoids by ligation, pexy, and possible resection.  Risks such as bleeding, infection, need for further treatment,  heart attack, death, and other risks were discussed.   I noted a good likelihood this will help address the problem.  Goals of post-operative recovery were discussed as well.  Possibility that this will not correct all symptoms was explained.  Post-operative pain, bleeding, constipation, and other problems after surgery were discussed.  We will work to minimize complications.   Questions were answered.  The patient expresses understanding & wishes to proceed with surgery.   OR FINDINGS: He had very engorged / inflammed  left lateral and right anterior hemorrhoidal piles.  Right posterior not as inflamed.  There is no evidence of thrombosis.  There is no evidence of perirectal abscess.  There is no evidence of rectal abscess.  There is no evidence of a fissure or fistula.  There was no proctitis.  Prostate normal.  DESCRIPTION:   Informed consent was confirmed. Patient underwent general anesthesia without difficulty. Patient was placed into lithotomy positioning given his massive central obesity.  The perianal region was prepped and draped in sterile fashion. Surgical tunnel confirmed or plan.  I did digital rectal examination and then transitioned over to anoscopy to get a sense of the anatomy.  Findings were as noted above.  Two 82M her bowels are very enlarged and engorged.  No definite thrombosis seen.  No abscess could be palpated nor found.  Sphincter is normal.  No fissure no fistula.  Anal canal had some moderate inflammation of the hemorrhoidal piles but otherwise was unremarkable.  The rectum looked normal.  To avoid having bleeding open wounds given his engorged hemorrhoidal piles,  I proceeded to ligate the hemorrhoidal arteries.  I used a 0 Vicryl suture on a UR-5 needle in a figure-of-eight fashion around 7 cm proximal to the anal verge. I then ran that stitch longitudinally more distally to the white line of [place]. I then tied that stitch down to cause a hemorrhoidopexy. I did that for all  6 locations (Right anterior, right lateral, right posterior, left anterior, left lateral, left posterior).    At completion of this, all hemorrhoids were reduced down & in the rectum. There was no prolapse. External anatomy looked normal.  I repeated anoscopy and examination.  Hemostasis was good.  Again no abscess, purulence, or other abnormal masses found.   Patient is being extubated go to recovery room.  I am about to discuss the patient's status to the family.

## 2012-07-04 NOTE — H&P (Signed)
Mark Luna  19-May-1981 [id number]   This patient is a 31 y.o.male who presents today for surgical evaluation at the request of Dr. Verne Spurr, [facility].   Reason for evaluation: Severe rectal pain.  Possible thrombosed hemorrhoid.  Superiorly morbidly obese male.  Has had severe anal pain for a week.  The pain is sharp particularly on the left side.  No major bleeding.  No prior problems with hemorrhoids that he can recall.  Has had hard stools more recently.  Pain is exquisite.  Could not tolerate a digital rectal exam.  Was about to go home.  Emergency room doctor ordered me to see if he could followup in her office.  Given the discussion of severe pain, I recommended evaluation here.  Initially hesitant to stay, he relented into talking with me.  The pain has been intense for a week.  No fevers chills or sweats.   He thinks he feels a lump when he defecates but not certain.  Was in the emergency room last month with testicular pain.  Diagnosed with sigmoid diverticulitis.  Varicocele.  Presumed cause of pain radiating to testicle.  Treated with oral antibiotics (Cipro/Flagyl).  Got better.  Denies any severe diarrhea or constipation.  No personal nor family history of GI/colon cancer, inflammatory bowel disease, irritable bowel syndrome, allergy such as Celiac Sprue, dietary/dairy problems, colitis, ulcers nor gastritis.  No recent sick contacts/gastroenteritis.  No travel outside the country.  No changes in diet.  He is not have a primary care physician.  He tends to come the emergency room for all issues.   Past Medical History  Diagnosis Date  . Asthma   . Back pain   . Diverticulitis of colon Jan 2014    Treated with PO ABx  . Obesity, morbid, BMI 50 or higher 07/04/2012    Past Surgical History  Procedure Date  . Cholecystectomy ~2006    [name]   . Wrist surgery     History   Social History  . Marital Status: Single    Spouse Name: N/A    Number of Children: N/A  .  Years of Education: N/A   Occupational History  . Not on file.   Social History Main Topics  . Smoking status: Current Every Day Smoker -- 0.2 packs/day  . Smokeless tobacco: Never Used  . Alcohol Use: Yes     Comment: socially  . Drug Use: No  . Sexually Active:    Other Topics Concern  . Not on file   Social History Narrative  . No narrative on file    No family history on file.  Current Facility-Administered Medications  Medication Dose Route Frequency Provider Last Rate Last Dose  . acetaminophen (TYLENOL) tablet 650 mg  650 mg Oral Q6H PRN Ardeth Sportsman, MD       Or  . acetaminophen (TYLENOL) suppository 650 mg  650 mg Rectal Q6H PRN Ardeth Sportsman, MD      . alum & mag hydroxide-simeth (MAALOX/MYLANTA) 200-200-20 MG/5ML suspension 30 mL  30 mL Oral Q6H PRN Ardeth Sportsman, MD      . dextrose 5 % in lactated ringers infusion   Intravenous Continuous Ardeth Sportsman, MD      . diphenhydrAMINE (BENADRYL) injection 12.5-25 mg  12.5-25 mg Intravenous Q6H PRN Ardeth Sportsman, MD       Or  . diphenhydrAMINE (BENADRYL) 12.5 MG/5ML elixir 12.5-25 mg  12.5-25 mg Oral Q6H PRN Ardeth Sportsman, MD      .  heparin injection 5,000 Units  5,000 Units Subcutaneous Q8H Ardeth Sportsman, MD      . HYDROmorphone (DILAUDID) injection 0.5-2 mg  0.5-2 mg Intravenous Q2H PRN Ardeth Sportsman, MD      . lactated ringers bolus 1,000 mL  1,000 mL Intravenous Q8H PRN Ardeth Sportsman, MD      . lip balm (CARMEX) ointment 1 application  1 application Topical BID Ardeth Sportsman, MD      . magic mouthwash  15 mL Oral QID PRN Ardeth Sportsman, MD      . magnesium hydroxide (MILK OF MAGNESIA) suspension 30 mL  30 mL Oral Q12H PRN Ardeth Sportsman, MD      . menthol-cetylpyridinium (CEPACOL) lozenge 3 mg  1 lozenge Oral PRN Ardeth Sportsman, MD      . metoprolol tartrate (LOPRESSOR) tablet 12.5 mg  12.5 mg Oral Q12H PRN Ardeth Sportsman, MD      . naproxen (NAPROSYN) tablet 500 mg  500 mg Oral BID WC Ardeth Sportsman, MD      . ondansetron [facility]) injection 4 mg  4 mg Intravenous Q6H PRN Ardeth Sportsman, MD      . oxyCODONE (Oxy IR/ROXICODONE) immediate release tablet 5-10 mg  5-10 mg Oral Q4H PRN Ardeth Sportsman, MD      . phenol (CHLORASEPTIC) mouth spray 2 spray  2 spray Mouth/Throat PRN Ardeth Sportsman, MD      . piperacillin-tazobactam (ZOSYN) IVPB 3.375 g  3.375 g Intravenous Q8H Ardeth Sportsman, MD      . promethazine (PHENERGAN) injection 12.5-25 mg  12.5-25 mg Intravenous Q6H PRN Ardeth Sportsman, MD      . psyllium (HYDROCIL/METAMUCIL) packet 1 packet  1 packet Oral BID Ardeth Sportsman, MD       Current Outpatient Prescriptions  Medication Sig Dispense Refill  . oxyCODONE-acetaminophen (PERCOCET/ROXICET) 5-325 MG per tablet Take 1 tablet by mouth every 4 (four) hours as needed. Pain      . oxyCODONE-acetaminophen (PERCOCET/ROXICET) 5-325 MG per tablet 1 to 2 tabs PO q6hrs  PRN for pain  15 tablet  0     No Known Allergies  ROS: Constitutional:  No fevers, chills, sweats.  Weight stable Eyes:  No vision changes, No discharge HENT:  No sore throats, nasal drainage Lymph: No neck swelling, No bruising easily Pulmonary:  No cough, productive sputum CV: No orthopnea, PND  Patient walks 20 minutes for about 1/4 miles without difficulty.  No exertional chest/neck/shoulder/arm pain. GI:  No personal nor family history of GI/colon cancer, inflammatory bowel disease, irritable bowel syndrome, allergy such as Celiac Sprue, dietary/dairy problems, colitis, ulcers nor gastritis.  No recent sick contacts/gastroenteritis.  No travel outside the country.  No changes in diet. Renal: No UTIs, No hematuria Genital:  No drainage, bleeding, masses Musculoskeletal: No severe joint pain.  Good ROM major joints Skin:  No sores or lesions.  No rashes Heme/Lymph:  No easy bleeding.  No swollen lymph nodes Neuro: No focal weakness/numbness.  No seizures Psych: No suicidal ideation.  No hallucinations  BP  132/66  Pulse 100  Temp 97.9 F (36.6 C) (Oral)  Resp 20  SpO2 99%  Physical Exam: General: Pt awake/alert/oriented x4 in mild acute distress.  Apple body habitus. Eyes: PERRL, normal EOM. Sclera nonicteric Neuro: CN II-XII intact w/o focal sensory/motor deficits. Lymph: No head/neck/groin lymphadenopathy Psych:  No delerium/psychosis/paranoia HENT: Normocephalic, Mucus membranes moist.  No thrush Neck: Supple, No  tracheal deviation Chest: No pain.  Good respiratory excursion. CV:  Pulses intact.  Regular rhythm Abdomen: Soft, Nondistended.  Superobese.  Hygiene good under panniculus.  Nontender.  No incarcerated hernias. GU:  NEMG.  Exam done with assistance of male Medical Assistant in the room.  Perianal skin clean with good hygiene.  No pruritis.  No external skin tags / hemorrhoids of significance.  No ext thrombosed hemorrhoid seen.  No pilonidal disease.  No fissure.  No external abscess/fistula.   Barely tolerates digital rectal exam.  Normal sphincter tone.  Right lateral swollen mass felt at 3cm from anal verge.  VERY TENDER.  DRE aborted Ext:  SCDs BLE.  No significant edema.  No cyanosis Skin: No petechiae / purpurea.  No major sores Musculoskeletal: No severe joint pain.  Good ROM major joints   Results:   Labs: Results for orders placed during the hospital encounter of 07/04/12 (from the past 48 hour(s))  CBC     Status: Abnormal   Collection Time   07/04/12  5:55 PM      Component Value Range Comment   WBC 8.8  4.0 - 10.5 K/uL    RBC 5.28  4.22 - 5.81 MIL/uL    Hemoglobin 13.8  13.0 - 17.0 g/dL    HCT 40.9  81.1 - 91.4 %    MCV 80.5  78.0 - 100.0 fL    MCH 26.1  26.0 - 34.0 pg    MCHC 32.5  30.0 - 36.0 g/dL    RDW 78.2 (*) 95.6 - 15.5 %    Platelets 327  150 - 400 K/uL   BASIC METABOLIC PANEL     Status: Normal   Collection Time   07/04/12  5:55 PM      Component Value Range Comment   Sodium 135  135 - 145 mEq/L    Potassium 4.4  3.5 - 5.1 mEq/L     Chloride 100  96 - 112 mEq/L    CO2 25  19 - 32 mEq/L    Glucose, Bld 84  70 - 99 mg/dL    BUN 13  6 - 23 mg/dL    Creatinine, Ser 2.13  0.50 - 1.35 mg/dL    Calcium 9.0  8.4 - 08.6 mg/dL    GFR calc non Af Amer >90  >90 mL/min    GFR calc Af Amer >90  >90 mL/min     Imaging / Studies: US Scrotum  06/13/2012  *RADIOLOGY REPORT*  Clinical Data:  Testicular pain.  SCROTAL ULTRASOUND DOPPLER ULTRASOUND OF THE TESTICLES  Technique: Complete ultrasound examination of the testicles, epididymis, and other scrotal structures was performed.  Color and spectral Doppler ultrasound were also utilized to evaluate blood flow to the testicles.  Comparison:  Scrotal ultrasound 09/29/2011.  Findings:  Right testis:  The right testis is of normal size and echotexture measuring 4.2 x 2.4 x 3.2 cm.  Normal color Doppler signal is evident.  Left testis:  The left testis is of normal size and echotexture measuring 4.1 x 2.3 x 3.7 cm.  Normal color Doppler signal is present.  Right epididymis:  Normal in size and appearance.  Left epididymis:  Normal in size and appearance.  Hydrocele:  Absent  Varicocele:  A left sided varicocele is present with veins measuring up to 3.8 mm.  Pulsed Doppler interrogation of both testes demonstrates low resistance flow bilaterally.  IMPRESSION:  1.  A left sided varicocele is now present. 2.  Normal appearance  of the testicles bilaterally. 3.  Normal color and spectral Doppler analysis of the testicles.   Original Report Authenticated By: Marin Roberts, M.D.    Ct Abdomen Pelvis W Contrast  06/13/2012  *RADIOLOGY REPORT*  Clinical Data: Left testicular pain.  CT ABDOMEN AND PELVIS WITH CONTRAST  Technique:  Multidetector CT imaging of the abdomen and pelvis was performed following the standard protocol during bolus administration of intravenous contrast.  Contrast: OMNIPAQUE IOHEXOL 300 MG/ML  SOLN  Comparison: CT of the abdomen and pelvis 01/09/2010.  Findings:  Lung Bases:  Unremarkable.  Abdomen/Pelvis:  Status post cholecystectomy.  The appearance of the liver, pancreas, spleen, bilateral adrenal glands and bilateral kidneys is unremarkable.  There are a few scattered colonic diverticula, and adjacent to the distal descending colon and proximal sigmoid colon there are some inflammatory changes suspicious for acute diverticulitis.  No definite diverticular abscess is identified at this time.  No pneumoperitoneum.  No significant volume of ascites.  No pathologic distension of bowel. No definite pathologic lymphadenopathy identified within the abdomen or pelvis.  Urinary bladder is unremarkable in appearance.  Musculoskeletal: There are no aggressive appearing lytic or blastic lesions noted in the visualized portions of the skeleton. 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  IMPRESSION: 1.  Acute diverticulitis of the distal descending and proximal sigmoid colon.  No definite diverticular abscess or findings to suggest perforation at this time. 2.  Status post cholecystectomy.  These results were called by telephone on 06/13/2012 at 06:23 p.m. to Dr. Preston Fleeting, who verbally acknowledged these results.   Original Report Authenticated By: Trudie Reed, M.D.    [place] Art/ven Flow Abd Pelv Doppler  06/13/2012  *RADIOLOGY REPORT*  Clinical Data:  Testicular pain.  SCROTAL ULTRASOUND DOPPLER ULTRASOUND OF THE TESTICLES  Technique: Complete ultrasound examination of the testicles, epididymis, and other scrotal structures was performed.  Color and spectral Doppler ultrasound were also utilized to evaluate blood flow to the testicles.  Comparison:  Scrotal ultrasound 09/29/2011.  Findings:  Right testis:  The right testis is of normal size and echotexture measuring 4.2 x 2.4 x 3.2 cm.  Normal color Doppler signal is evident.  Left testis:  The left testis is of normal size and echotexture measuring 4.1 x 2.3 x 3.7 cm.  Normal color Doppler signal is present.   Right epididymis:  Normal in size and appearance.  Left epididymis:  Normal in size and appearance.  Hydrocele:  Absent  Varicocele:  A left sided varicocele is present with veins measuring up to 3.8 mm.  Pulsed Doppler interrogation of both testes demonstrates low resistance flow bilaterally.  IMPRESSION:  1.  A left sided varicocele is now present. 2.  Normal appearance of the testicles bilaterally. 3.  Normal color and spectral Doppler analysis of the testicles.   Original Report Authenticated By: Marin Roberts, M.D.     Medications / Allergies: per chart  Antibiotics: Anti-infectives     Start     Dose/Rate Route Frequency Ordered Stop   07/04/12 1845  piperacillin-tazobactam (ZOSYN) IVPB 3.375 g       3.375 g 12.5 mL/hr over 240 Minutes Intravenous 3 times per day 07/04/12 1833            Assessment  Mark Luna  32 y.o. male     Procedure(s): RECTAL EXAM UNDER ANESTHESIA INCISION AND DRAINAGE ABSCESS  Problem List:  Principal Problem:  *Rectal abscess Active Problems:  Obesity, morbid, BMI 50 or higher  H/O diverticulitis of colon  Varicocele, left scrotum   Severe rectal pain suspicious for rectal abscess vs. Thrombosed hemorrhoid  Plan:  At this point, I cannot fully examine him.  I do not see how I can treat this at the bedside.  I recommended examination under general anesthesia.  That way I could lance a thrombosed hemorrhoid or rectal abscess.  It would be diagnostic and therapeutic.  I do not see any venous to doing a CAT scan at this time.  There is no evidence of Fournier's gangrene or anything more diffuse.  He seemed to be disappointed with the inconvenience of having surgery, but after talking with him he understands that there is no other way to diagnose or help take care of him since he is so miserable and cannot tolerate digital exam.  I discussed the procedure with him:  The anatomy and physiology of skin and rectal abscesses was discussed.  Pathophysiology of SQ abscess, possible progression to fasciitis & sepsis, etc discussed.  Pathophysiology of the thrombosed hemorrhoid was discussed as well.  I stressed good hygiene & wound care.  I recommended examination under anesthesia to surgically drain and treat the problem.   Possible redebridement was discussed as well.  Possibility of recurrence was discussed. Risks, benefits, alternatives were discussed. I noted a good likelihood this will help address the problem.   Risks of anesthesia and other risks discussed. Questions answered.  The patient is agrees to proceed with surgery.  -Super morbid obesity: Consider aggressive weight loss plan.  Probable weight loss surgery needed to help correct it.  Will defer more discussion at a later time. -VTE prophylaxis- SCDs, etc -mobilize as tolerated to help recovery    Ardeth Sportsman, M.D., F.A.C.S. Gastrointestinal and Minimally Invasive Surgery [facility] [address] [place], [place] [address] [phone] [address] / Paging [phone] Voice Mail   07/04/2012  CARE TEAM:  PCP: Default, Provider, MD  Outpatient Care Team: Patient Care Team: Provider Default, MD as PCP - General  Inpatient Treatment Team: Treatment Team: Attending Provider: Laray Anger, DO; Registered Nurse: Garwin Brothers, RN; Physician Assistant: Wynetta Emery, PA-C; Technician: Merian Capron, EMT; Attending Physician: Bishop Limbo, MD \

## 2012-07-04 NOTE — ED Notes (Signed)
OR called will be coming in approx 15 mins.

## 2012-07-04 NOTE — Anesthesia Preprocedure Evaluation (Addendum)
Anesthesia Evaluation  Patient identified by MRN, date of birth, ID band Patient awake    Reviewed: Allergy & Precautions, H&P , NPO status , Patient's Chart, lab work & pertinent test results  Airway Mallampati: III TM Distance: >3 FB Neck ROM: full    Dental No notable dental hx. (+) Teeth Intact and Dental Advisory Given Missing some back teeth:   Pulmonary neg pulmonary ROS, asthma , Current Smoker,  breath sounds clear to auscultation  Pulmonary exam normal       Cardiovascular Exercise Tolerance: Good negative cardio ROS  Rhythm:regular Rate:Normal     Neuro/Psych negative neurological ROS  negative psych ROS   GI/Hepatic negative GI ROS, Neg liver ROS,   Endo/Other  negative endocrine ROSMorbid obesity  Renal/GU negative Renal ROS  negative genitourinary   Musculoskeletal   Abdominal (+) + obese,   Peds  Hematology negative hematology ROS (+)   Anesthesia Other Findings   Reproductive/Obstetrics negative OB ROS                          Anesthesia Physical Anesthesia Plan  ASA: III  Anesthesia Plan: General   Post-op Pain Management:    Induction: Intravenous  Airway Management Planned: Oral ETT  Additional Equipment:   Intra-op Plan:   Post-operative Plan: Extubation in OR  Informed Consent: I have reviewed the patients History and Physical, chart, labs and discussed the procedure including the risks, benefits and alternatives for the proposed anesthesia with the patient or authorized representative who has indicated his/her understanding and acceptance.   Dental Advisory Given  Plan Discussed with: CRNA and Surgeon  Anesthesia Plan Comments:         Anesthesia Quick Evaluation

## 2012-07-04 NOTE — Transfer of Care (Signed)
Immediate Anesthesia Transfer of Care Note  Patient: Mark Luna  Procedure(s) Performed: Procedure(s) (LRB) with comments: RECTAL EXAM UNDER ANESTHESIA (N/A) - Hemmorrhodial Ligation and Pexy  Patient Location: PACU  Anesthesia Type:General  Level of Consciousness: awake, alert  and oriented  Airway & Oxygen Therapy: Patient Spontanous Breathing and Patient connected to face mask oxygen  Post-op Assessment: Report given to PACU RN, Post -op Vital signs reviewed and stable and Patient moving all extremities X 4  Post vital signs: Reviewed and stable  Complications: No apparent anesthesia complications

## 2012-07-04 NOTE — ED Notes (Signed)
Pt is having rectal bleeding and states that he thinks it is hemroids and he has pain all the time at first it was just when he was having a bm. Bright red blood, thinks he has them internally,

## 2012-07-04 NOTE — ED Provider Notes (Signed)
History     CSN: [id number]  Arrival date & time 07/04/12  1356   First MD Initiated Contact with Patient 07/04/12 1631      Chief Complaint  Patient presents with  . Rectal Bleeding    (Consider location/radiation/quality/duration/timing/severity/associated sxs/prior treatment) HPI  Mark Luna is a 32 y.o. male complaining rectal pain worsening over the course the last 5 days.  Patient states the pain is 8/10 at the spine and increases to 10 out of 10 at defecation.  He does report blood-streaked stool with slight blood on the toilet paper.  She does not have any masses and does not feel any abnormality outside of the anus.  Patient does not participate in anal sex.  Has had no similar prior exacerbations in the past.  Denies fever, abdominal pain, nausea vomiting.   Past Medical History  Diagnosis Date  . Morbid obesity   . Asthma   . Back pain     Past Surgical History  Procedure Date  . Cholecystectomy   . Wrist surgery   . Wrist surgery     No family history on file.  History  Substance Use Topics  . Smoking status: Current Every Day Smoker -- 0.2 packs/day  . Smokeless tobacco: Never Used  . Alcohol Use: Yes     Comment: socially      Review of Systems  Constitutional: Negative for fever.  Respiratory: Negative for shortness of breath.   Cardiovascular: Negative for chest pain.  Gastrointestinal: Positive for blood in stool, anal bleeding and rectal pain. Negative for nausea, vomiting, abdominal pain and diarrhea.  All other systems reviewed and are negative.    Allergies  Review of patient's allergies indicates no known allergies.  Home Medications   Current Outpatient Rx  Name  Route  Sig  Dispense  Refill  . CIPROFLOXACIN HCL 500 MG PO TABS   Oral   Take 1 tablet (500 mg total) by mouth every 12 (twelve) hours.   20 tablet   0   . METOCLOPRAMIDE HCL 10 MG PO TABS   Oral   Take 1 tablet (10 mg total) by mouth every 6 (six) hours as  needed (nausea).   30 tablet   0   . METRONIDAZOLE 500 MG PO TABS   Oral   Take 1 tablet (500 mg total) by mouth 3 (three) times daily.   30 tablet   0   . OXYCODONE-ACETAMINOPHEN 5-325 MG PO TABS   Oral   Take 1 tablet by mouth every 4 (four) hours as needed for pain.   20 tablet   0     BP 132/66  Pulse 100  Temp 97.9 F (36.6 C) (Oral)  Resp 20  SpO2 99%  Physical Exam  Nursing note and vitals reviewed. Constitutional: He is oriented to person, place, and time. He appears well-developed and well-nourished. No distress.       Morbidly obese  HENT:  Head: Normocephalic.  Mouth/Throat: Oropharynx is clear and moist.  Eyes: Conjunctivae normal and EOM are normal. Pupils are equal, round, and reactive to light.  Cardiovascular: Normal rate, regular rhythm and intact distal pulses.   Pulmonary/Chest: Effort normal and breath sounds normal. No stridor.  Abdominal: Soft. There is no tenderness.  Genitourinary:       No external hernia or rectal abnormalities including rashes fissures erythema or irritation.  No prolapsed hemorrhoids.  Rectal exam shows good tone good likely from internal hemorrhoids.  Exam is limited secondary  to extreme pain on DRE.   Musculoskeletal: Normal range of motion.  Neurological: He is alert and oriented to person, place, and time.  Psychiatric: He has a normal mood and affect.    ED Course  Procedures (including critical care time)  Labs Reviewed  CBC - Abnormal; Notable for the following:    RDW 16.6 (*)     All other components within normal limits  BASIC METABOLIC PANEL  BASIC METABOLIC PANEL  CBC   No results found.   1. Hemorrhoids, internal       MDM  The patient was likely thrombosed internal hemorrhoid.  Surgery consult from Dr. Michaell Cowing appreciated: He recommends CT pelvis to rule out abscess.  Patient is afebrile and has no leukocytosis. Patient will be admitted to his service.   Filed Vitals:   07/04/12 1420  BP:  132/66  Pulse: 100  Temp: 97.9 F (36.6 C)  TempSrc: Oral  Resp: 20  SpO2: 99%        Wynetta Emery, PA-C 07/04/12 1904

## 2012-07-04 NOTE — Anesthesia Postprocedure Evaluation (Signed)
  Anesthesia Post-op Note  Patient: Mark Luna  Procedure(s) Performed: Procedure(s) (LRB): RECTAL EXAM UNDER ANESTHESIA (N/A)  Patient Location: PACU  Anesthesia Type: General  Level of Consciousness: awake and alert   Airway and Oxygen Therapy: Patient Spontanous Breathing  Post-op Pain: mild  Post-op Assessment: Post-op Vital signs reviewed, Patient's Cardiovascular Status Stable, Respiratory Function Stable, Patent Airway and No signs of Nausea or vomiting  Last Vitals:  Filed Vitals:   07/04/12 2200  BP: 148/72  Pulse: 79  Temp: 36.5 C  Resp: 20    Post-op Vital Signs: stable   Complications: No apparent anesthesia complications

## 2012-07-04 NOTE — ED Notes (Signed)
OR

## 2012-07-05 ENCOUNTER — Encounter (HOSPITAL_COMMUNITY): Payer: Self-pay | Admitting: General Practice

## 2012-07-05 LAB — BASIC METABOLIC PANEL
CO2: 24 mEq/L (ref 19–32)
GFR calc non Af Amer: >90 mL/min (ref >90)
Glucose, Bld: 117 mg/dL — ABNORMAL HIGH (ref 70–99)
Potassium: 4.4 mEq/L (ref 3.5–5.1)
Sodium: 133 mEq/L — ABNORMAL LOW (ref 135–145)

## 2012-07-05 LAB — CBC
Hemoglobin: 12.7 g/dL — ABNORMAL LOW (ref 13.0–17.0)
MCH: 25.9 pg — ABNORMAL LOW (ref 26.0–34.0)
MCV: 80.4 fL (ref 78.0–100.0)
RBC: 4.91 MIL/uL (ref 4.22–5.81)
WBC: 14 10*3/uL — ABNORMAL HIGH (ref 4.0–10.5)

## 2012-07-05 MED ORDER — INFLUENZA VIRUS VACC SPLIT PF IM SUSP
0.5000 mL | Freq: Once | INTRAMUSCULAR | Status: AC
  Administered 2012-07-05: 0.5 mL via INTRAMUSCULAR
  Filled 2012-07-05 (×2): qty 0.5

## 2012-07-05 MED ORDER — OXYCODONE-ACETAMINOPHEN 5-325 MG PO TABS: 1.0000 | ORAL_TABLET | ORAL | Status: DC | PRN

## 2012-07-05 MED ORDER — HYDROCORTISONE ACE-PRAMOXINE 2.5-1 % RE CREA: 1.0000 "application " | TOPICAL_CREAM | Freq: Four times a day (QID) | RECTAL | Status: DC

## 2012-07-05 MED ORDER — WITCH HAZEL-GLYCERIN EX PADS: 1.0000 "application " | MEDICATED_PAD | CUTANEOUS | Status: DC | PRN

## 2012-07-05 NOTE — Care Management Note (Signed)
    Page 1 of 1   07/05/2012     2:03:16 PM   CARE MANAGEMENT NOTE 07/05/2012  Patient:  Mark Luna, Mark Luna   Account Number:  [id number]  Date Initiated:  07/05/2012  Documentation initiated by:  Lorenda Ishihara  Subjective/Objective Assessment:   31 yo male admitted s/p hemorroidectomy. PTA lived at home with spouse.     Action/Plan:   Home when stable   Anticipated DC Date:  07/05/2012   Anticipated DC Plan:  HOME/SELF CARE      DC Planning Services  CM consult      Choice offered to / List presented to:             Status of service:  Completed, signed off Medicare Important Message given?   (If response is "NO", the following Medicare IM given date fields will be blank) Date Medicare IM given:   Date Additional Medicare IM given:    Discharge Disposition:  HOME/SELF CARE  Per UR Regulation:  Reviewed for med. necessity/level of care/duration of stay  If discussed at Long Length of Stay Meetings, dates discussed:    Comments:

## 2012-07-05 NOTE — Discharge Summary (Signed)
Patient ID: Mark Luna MRN: [id number] DOB/AGE: [birth year] 31 y.o.  Admit date: 07/04/2012 Discharge date: 07/05/2012  Procedures: RECTAL EXAM UNDER ANESTHESIA  Hemorrhoidal ligation and sutured pexy x 6  Consults: None  Reason for Admission: Superiorly morbidly obese male. Has had severe anal pain for a week. The pain is sharp particularly on the left side. No major bleeding. No prior problems with hemorrhoids that he can recall. Has had hard stools more recently. Pain is exquisite. Could not tolerate a digital rectal exam. Was about to go home. Emergency room doctor ordered me to see if he could followup in her office. Given the discussion of severe pain, I recommended evaluation here. Initially hesitant to stay, he relented into talking with me. The pain has been intense for a week. No fevers chills or sweats. He thinks he feels a lump when he defecates but not certain.  Was in the emergency room last month with testicular pain. Diagnosed with sigmoid diverticulitis. Varicocele. Presumed cause of pain radiating to testicle. Treated with oral antibiotics (Cipro/Flagyl). Got better. Denies any severe diarrhea or constipation. No personal nor family history of GI/colon cancer, inflammatory bowel disease, irritable bowel syndrome, allergy such as Celiac Sprue, dietary/dairy problems, colitis, ulcers nor gastritis. No recent sick contacts/gastroenteritis. No travel outside the country. No changes in diet.  Admission Diagnoses:  Principal Problem:  *Rectal abscess  Active Problems:  Obesity, morbid, BMI 50 or higher  H/O diverticulitis of colon  Varicocele, left scrotum  Hospital Course: The patient was admitted and taken to the operating room where he had an EUA.  He was found to have NO abscess or thrombosed hemorrhoids.  He was found to have engorged hemorrhoids which were suture ligated and pexied.  He tolerated this well.  On POD#1, his pain was well controlled and he was felt stable for  dc home.  PE: Rectum: unable to see rectum due to morbid obesity  Discharge Diagnoses:  Principal Problem:  *Internal hemorrhoids with pain/swelling/prolapse Active Problems:  Obesity, morbid, BMI 50 or higher  H/O diverticulitis of colon  Varicocele, left scrotum   Discharge Medications:   Medication List     As of 07/05/2012 11:56 AM    TAKE these medications         hydrocortisone-pramoxine 2.5-1 % rectal cream   Commonly known as: ANALPRAM-HC   Place rectally 4 (four) times daily. For irritated and painful hemorrhoids      hydrocortisone-pramoxine 2.5-1 % rectal cream   Commonly known as: ANALPRAM-HC   Place 1 application rectally 4 (four) times daily.      oxyCODONE 5 MG immediate release tablet   Commonly known as: Oxy IR/ROXICODONE   Take 1-2 tablets (5-10 mg total) by mouth every 4 (four) hours as needed for pain.      oxyCODONE-acetaminophen 5-325 MG per tablet   Commonly known as: PERCOCET/ROXICET   1 to 2 tabs PO q6hrs  PRN for pain      oxyCODONE-acetaminophen 5-325 MG per tablet   Commonly known as: PERCOCET/ROXICET   Take 1-2 tablets by mouth every 4 (four) hours as needed. Pain      witch hazel-glycerin pad   Commonly known as: TUCKS   Apply 1 application topically as needed.        Discharge Instructions:     Follow-up Information    Follow up with GROSS,STEVEN C., MD. Schedule an appointment as soon as possible for a visit in 3 weeks. (As needed)    Contact information:  [address] [place] [address] [phone]       Follow up with [facility] (If symptoms worsen)    Contact information:   [address] [id number] [place] [address] [phone]         Signed: Letha Cape 07/05/2012, 11:56 AM

## 2012-07-06 NOTE — ED Provider Notes (Signed)
Medical screening examination/treatment/procedure(s) were performed by non-physician practitioner and as supervising physician I was immediately available for consultation/collaboration.   Laray Anger, DO 07/06/12 1714

## 2012-07-09 ENCOUNTER — Telehealth (INDEPENDENT_AMBULATORY_CARE_PROVIDER_SITE_OTHER): Payer: Self-pay

## 2012-07-09 NOTE — Telephone Encounter (Signed)
Returned pt's call after speaking with Dr Michaell Cowing. The pt was made aware that it's common to have the bleeding after a THD per Dr Michaell Cowing. The pt really needs to be taking Advil,Ibuprofen,or Aleve along with taking his Oxycodone. Dr Michaell Cowing did advise pt could increase the tablets on Oxycodone every 4hrs. The pt can take 3-4 tabs of the Oxycodone 5mg  every 4hrs per Dr Michaell Cowing b/c of the pt's size. I advised pt that he needs to make sure he keeps his bowels soft with stool softtners and increasing his fluid intake. I made a f/u appt for the pt to see Dr Michaell Cowing. I asked the [facility] to write a new script for Oxycodone 5mg  #60 per Dr Michaell Cowing. I made a copy for the chart. The script is at the front desk for pt to p/u.

## 2012-07-09 NOTE — Telephone Encounter (Signed)
Pt calling in b/c he is having so much pain after EUA done by Dr Michaell Cowing on 07/04/12. The pt is c/o bleeding with BM's and having intense rectal pain. The pt has been taking his Oxycodone 5mg  1-2 tabs every 4hrs but not taking any antiinflammatories . The pt is not able to get in a sitz bath either b/c of his size he is only able to use warm compresses. I advised pt that I would page Dr Michaell Cowing to go over this info. With him and the pt understands.

## 2012-07-10 ENCOUNTER — Telehealth (INDEPENDENT_AMBULATORY_CARE_PROVIDER_SITE_OTHER): Payer: Self-pay

## 2012-07-10 NOTE — Telephone Encounter (Signed)
The pt called and reports that he got his Oxycodone script yesterday.  There is no quantity on it.  I told him to bring it by so we can fix that.  It is supposed to be #60.

## 2012-07-31 ENCOUNTER — Ambulatory Visit (INDEPENDENT_AMBULATORY_CARE_PROVIDER_SITE_OTHER): Payer: BC Managed Care – PPO | Admitting: Surgery

## 2012-07-31 ENCOUNTER — Encounter (INDEPENDENT_AMBULATORY_CARE_PROVIDER_SITE_OTHER): Payer: Self-pay | Admitting: Surgery

## 2012-07-31 VITALS — BP 140/62 | HR 83 | Temp 96.3°F | Resp 18 | Ht 66.0 in | Wt >= 6400 oz

## 2012-07-31 DIAGNOSIS — K648 Other hemorrhoids: Secondary | ICD-10-CM

## 2012-07-31 NOTE — Patient Instructions (Signed)
HEMORRHOIDS  The rectum is the last foot of your colon, and it naturally stretches to hold stool.  Hemorrhoidal piles are natural clusters of blood vessels that help the rectum and anal canal stretch to hold stool and allow bowel movements to eliminate feces.   Hemorrhoids are abnormally swollen blood vessels in the rectum.  Too much pressure in the rectum causes hemorrhoids by forcing blood to stretch and bulge the walls of the veins, sometimes even rupturing them.  Hemorrhoids can become like varicose veins you might see on a person's legs.  Most people will develop a flare of hemorrhoids in their lifetime.  When bulging hemorrhoidal veins are irritated, they can swell, burn, itch, cause pain, and bleed.  Most flares will calm down gradually own within a few weeks.  However, once hemorrhoids are created, they are difficult to get rid of completely and tend to flare more easily than the first flare.   Fortunately, good habits and simple medical treatment usually control hemorrhoids well, and surgery is needed only in severe cases. Types of Hemorrhoids:  Internal hemorrhoids usually don't initially hurt or itch; they are deep inside the rectum and usually have no sensation. If they begin to push out (prolapse), pain and burning can occur.  However, internal hemorrhoids can bleed.  Anal bleeding should not be ignored since bleeding could come from a dangerous source like colorectal cancer, so persistent rectal bleeding should be investigated by a doctor, sometimes with a colonoscopy.  External hemorrhoids cause most of the symptoms - pain, burning, and itching. Nonirritated hemorrhoids can look like small skin tags coming out of the anus.   Thrombosed hemorrhoids can form when a hemorrhoid blood vessel bursts and causes the hemorrhoid to suddenly swell.  A purple blood clot can form in it and become an excruciatingly painful lump at the anus. Because of these unpleasant symptoms, immediate incision and  drainage by a surgeon at an office visit can provide much relief of the pain.    PREVENTION Avoiding the most frequent causes listed below will prevent most cases of hemorrhoids: Constipation Hard stools Diarrhea  Constant sitting  Straining with bowel movements Sitting on the toilet for a long time  Severe coughing  episodes Pregnancy / Childbirth  Heavy Lifting  Sometimes avoiding the above triggers is difficult:  How can you avoid sitting all day if you have a seated job? Also, we try to avoid coughing and diarrhea, but sometimes it's beyond your control.  Still, there are some practical hints to help: Keep the anal and genital area clean.  Moistened tissues such as flushable wet wipes are less irritating than toilet paper.  Using irrigating showers or bottle irrigation washing gently cleans this sensitive area.   Avoid dry toilet paper when cleaning after bowel movements.  Marland Kitchen Keep the anal and genital area dry.  Lightly pat the rectal area dry.  Avoid rubbing.  Talcum or baby powders can help GET YOUR STOOLS SOFT.   This is the most important way to prevent irritated hemorrhoids.  Hard stools are like sandpaper to the anorectal canal and will cause more problems.  The goal: ONE SOFT BOWEL MOVEMENT A DAY!  BMs from every other day to 3 times a day is a tolerable range Treat coughing, diarrhea and constipation early since irritated hemorrhoids may soon follow.  If your main job activity is seated, always stand or walk during your breaks. Make it a point to stand and walk at least 5 minutes every hour  and try to shift frequently in your chair to avoid direct rectal pressure.  Always exhale as you strain or lift. Don't hold your breath.  Do not delay or try to prevent a bowel movement when the urge is present. Exercise regularly (walking or jogging 60 minutes a day) to stimulate the bowels to move. No reading or other activity while on the toilet. If bowel movements take longer than 5 minutes,  you are too constipated. AVOID CONSTIPATION Drink plenty of liquids (1 1/2 to 2 quarts of water and other fluids a day unless fluid restricted for another medical condition). Liquids that contain caffeine (coffee a, tea, soft drinks) can be dehydrating and should be avoided until constipation is controlled. Consider minimizing milk, as dairy products may be constipating. Eat plenty of fiber (30g a day ideal, more if needed).  Fiber is the undigested part of plant food that passes into the colon, acting as "natures broom" to encourage bowel motility and movement.  Fiber can absorb and hold large amounts of water. This results in a larger, bulkier stool, which is soft and easier to pass.  Eating foods high in fiber - 12 servings - such as  Vegetables: Root (potatoes, carrots, turnips), Leafy green (lettuce, salad greens, celery, spinach), High residue (cabbage, broccoli, etc.) Fruit: Fresh, Dried (prunes, apricots, cherries), Stewed (applesauce)  Whole grain breads, pasta, whole wheat Bran cereals, muffins, etc. Consider adding supplemental bulking fiber which retains large volumes of water: Psyllium ground seeds --available as Metamucil, Konsyl, Effersyllium, Per Diem Fiber, or the less expensive generic forms.  Citrucel  (methylcellulose wood fiber) . FiberCon (Polycarbophil) Polyethylene Glycol - and "artificial" fiber commonly called Miralax or Glycolax.  It is helpful for people with gassy or bloated feelings with regular fiber Flax Seed - a less gassy natural fiber  Laxatives can be useful for a short period if constipation is severe Osmotics (Milk of Magnesia, Fleets Phospho-Soda, Magnesium Citrate)  Stimulants (Senokot,   Castor Oil,  Dulcolax, Ex-Lax)    Laxatives are not a good long-term solution as it can stress the bowels and cause too much mineral loss and dehydration.   Avoid taking laxatives for more than 7 days in a row.  AVOID DIARRHEA Switch to liquids and simpler foods for a few  days to avoid stressing your intestines further. Avoid dairy products (especially milk & ice cream) for a short time.  The intestines often can lose the ability to digest lactose when stressed. Avoid foods that cause gassiness or bloating.  Typical foods include beans and other legumes, cabbage, broccoli, and dairy foods.  Every person has some sensitivity to other foods, so listen to your body and avoid those foods that trigger problems for you. Adding fiber (Citrucel, Metamucil, FiberCon, Flax seed, Miralax) gradually can help thicken stools by absorbing excess fluid and retrain the intestines to act more normally.  Slowly increase the dose over a few weeks.  Too much fiber too soon can backfire and cause cramping & bloating. Probiotics (such as active yogurt, Align, etc) may help repopulate the intestines and colon with normal bacteria and calm down a sensitive digestive tract.  Most studies show it to be of mild help, though, and such products can be costly. Medicines: Bismuth subsalicylate (ex. Kayopectate, Pepto Bismol) every 30 minutes for up to 6 doses can help control diarrhea.  Avoid if pregnant. Loperamide (Immodium) can slow down diarrhea.  Start with two tablets (48m total) first and then try one tablet every 6 hours.  Avoid if you are having fevers or severe pain.  If you are not better or start feeling worse, stop all medicines and call your doctor for advice Call your doctor if you are getting worse or not better.  Sometimes further testing (cultures, endoscopy, X-ray studies, bloodwork, etc) may be needed to help diagnose and treat the cause of the diarrhea. TREATMENT OF HEMORRHOID FLARE If these preventive measures fail, you must take action right away! Hemorrhoids are one condition that can be mild in the morning and become intolerable by nightfall. Most hemorrhoidal flares take several weeks to calm down.  These suggestions can help: Warm soaks.  This helps more than any topical  medication.  Use up to 8 times a day.  Usually sitz baths or sitting in a warm bathtub helps.  Sitting on moist warm towels are helpful.  Switching to ice packs/cool compresses can be helpful Normalize your bowels.  Extremes of diarrhea or constipation will make hemorrhoids worse.  One soft bowel movement a day is the goal.  Fiber can help get your bowels regular Wet wipes instead of toilet paper Pain control with a NSAID such as ibuprofen (Advil) or naproxen (Aleve) or acetaminophen (Tylenol) around the clock.  Narcotics are constipating and should be minimized if possible Topical creams contain steroids (bydrocortisone) or local anesthetic (xylocaine) can help make pain and itching more tolerable.   EVALUATION If hemorrhoids are still causing problems, you could benefit by an evaluation by a surgeon.  The surgeon will obtain a history and examine you.  If hemorrhoids are diagnosed, some therapies can be offered in the office, usually with an anoscope into the less sensitive area of the rectum: -injection of hemorrhoids (sclerotherapy) can scar the blood vessels of the swollen/enlarged hemorrhoids to help shrink them down to a more normal size -rubber banding of the enlarged hemorrhoids to help shrink them down to a more normal size -drainage of the blood clot causing a thrombosed hemorrhoid,  to relieve the severe pain   While 90% of the time such problems from hemorrhoids can be managed without preceding to surgery, sometimes the hemorrhoids require a operation to control the problem (uncontrolled bleeding, prolapse, pain, etc.).   This involves being placed under general anesthesia where the surgeon can confirm the diagnosis and remove, suture, or staple the hemorrhoid(s).  Your surgeon can help you treat the problem appropriately.    ANORECTAL SURGERY: POST OP INSTRUCTIONS  1. Take your usually prescribed home medications unless otherwise directed. 2. DIET: Follow a light bland diet the first 24  hours after arrival home, such as soup, liquids, crackers, etc.  Be sure to include lots of fluids daily.  Avoid fast food or heavy meals as your are more likely to get nauseated.  Eat a low fat the next few days after surgery.   3. PAIN CONTROL: a. Pain is best controlled by a usual combination of three different methods TOGETHER: i. Ice/Heat ii. Over the counter pain medication iii. Prescription pain medication b. Most patients will experience some swelling and discomfort in the anus/rectal area. and incisions.  Ice packs or heat (30-60 minutes up to 6 times a day) will help. Use ice for the first few days to help decrease swelling and bruising, then switch to heat such as warm towels, sitz baths, warm baths, etc to help relax tight/sore spots and speed recovery.  Some people prefer to use ice alone, heat alone, alternating between ice & heat.  Experiment to what works for  you.  Swelling and bruising can take several weeks to resolve.   c. It is helpful to take an over-the-counter pain medication regularly for the first few weeks.  Choose one of the following that works best for you: i. Naproxen (Aleve, etc)  Two 220mg  tabs twice a day ii. Ibuprofen (Advil, etc) Three 200mg  tabs four times a day (every meal & bedtime) iii. Acetaminophen (Tylenol, etc) 500-650mg  four times a day (every meal & bedtime) d. A  prescription for pain medication (such as oxycodone, hydrocodone, etc) should be given to you upon discharge.  Take your pain medication as prescribed.  i. If you are having problems/concerns with the prescription medicine (does not control pain, nausea, vomiting, rash, itching, etc), please call us [phone] to see if we need to switch you to a different pain medicine that will work better for you and/or control your side effect better. ii. If you need a refill on your pain medication, please contact your pharmacy.  They will contact our office to request authorization. Prescriptions will not  be filled after 5 pm or on week-ends. 4. KEEP YOUR BOWELS REGULAR a. The goal is one bowel movement a day b. Avoid getting constipated.  Between the surgery and the pain medications, it is common to experience some constipation.  Increasing fluid intake and taking a fiber supplement (such as Metamucil, Citrucel, FiberCon, MiraLax, etc) 1-2 times a day regularly will usually help prevent this problem from occurring.  A mild laxative (prune juice, Milk of Magnesia, MiraLax, etc) should be taken according to package directions if there are no bowel movements after 48 hours. c. Watch out for diarrhea.  If you have many loose bowel movements, simplify your diet to bland foods & liquids for a few days.  Stop any stool softeners and decrease your fiber supplement.  Switching to mild anti-diarrheal medications (Kayopectate, Pepto Bismol) can help.  If this worsens or does not improve, please call us.  5. Wound Care a. Remove your bandages the day after surgery.  Unless discharge instructions indicate otherwise, leave your bandage dry and in place overnight.  Remove the bandage during your first bowel movement.   b. Allow the wound packing to fall out over the next few days.  You can trim exposed gauze / ribbon as it falls out.  You do not need to repack the wound unless instructed otherwise.  Wear an absorbent pad or soft cotton gauze in your underwear as needed to catch any drainage and help keep the area  c. Keep the area clean and dry.  Bathe / shower every day.  Keep the area clean by showering / bathing over the incision / wound.   It is okay to soak an open wound to help wash it.  Wet wipes or showers / gentle washing after bowel movements is often less traumatic than regular toilet paper. d. Bonita Quin may have some styrofoam-like soft packing in the rectum which will come out with the first bowel movement.  e. You will often notice bleeding with bowel movements.  This should slow down by the end of the first  week of surgery f. Expect some drainage.  This should slow down, too, by the end of the first week of surgery.  Wear an absorbent pad or soft cotton gauze in your underwear until the drainage stops. 6. ACTIVITIES as tolerated:   a. You may resume regular (light) daily activities beginning the next day-such as daily self-care, walking, climbing stairs-gradually increasing activities  as tolerated.  If you can walk 30 minutes without difficulty, it is safe to try more intense activity such as jogging, treadmill, bicycling, low-impact aerobics, swimming, etc. b. Save the most intensive and strenuous activity for last such as sit-ups, heavy lifting, contact sports, etc  Refrain from any heavy lifting or straining until you are off narcotics for pain control.   c. DO NOT PUSH THROUGH PAIN.  Let pain be your guide: If it hurts to do something, don't do it.  Pain is your body warning you to avoid that activity for another week until the pain goes down. d. You may drive when you are no longer taking prescription pain medication, you can comfortably sit for long periods of time, and you can safely maneuver your car and apply brakes. e. Bonita Quin may have sexual intercourse when it is comfortable.  7. FOLLOW UP in our office a. Please call [facility] at [phone] to set up an appointment to see your surgeon in the office for a follow-up appointment approximately 2 weeks after your surgery. b. Make sure that you call for this appointment the day you arrive home to insure a convenient appointment time. 10. IF YOU HAVE DISABILITY OR FAMILY LEAVE FORMS, BRING THEM TO THE OFFICE FOR PROCESSING.  DO NOT GIVE THEM TO YOUR DOCTOR.        WHEN TO CALL us [phone]: 1. Poor pain control 2. Reactions / problems with new medications (rash/itching, nausea, etc)  3. Fever over 101.5 F (38.5 C) 4. Inability to urinate 5. Nausea and/or vomiting 6. Worsening swelling or bruising 7. Continued bleeding from  incision. 8. Increased pain, redness, or drainage from the incision  The clinic staff is available to answer your questions during regular business hours (8:30am-5pm).  Please don't hesitate to call and ask to speak to one of our nurses for clinical concerns.   A surgeon from [facility] is always on call at the hospitals   If you have a medical emergency, go to the nearest emergency room or call 911.    [facility] [address], [address], [place], [place]  [address] ? MAIN: [phone] ? TOLL FREE: [phone] ? FAX [phone] [email or web address]

## 2012-07-31 NOTE — Progress Notes (Signed)
Subjective:     Patient ID: Mark Luna, male   DOB: [birth year], 32 y.o.   MRN: [id number]  HPI  Mark Luna  Feb 20, 1981 [id number]  Patient Care Team: Mark Lunz, MD as PCP - General  This patient is a 32 y.o.male who presents today for surgical evaluation  POST-OPERATIVE DIAGNOSIS:  Inflamed hemorrhoids  PROCEDURE: Procedure(s):  RECTAL EXAM UNDER ANESTHESIA  Hemorrhoidal ligation and sutured pexy x 6  Patient comes in today feeling better.  Had a lot of soreness initially but that is going down.  Still feels like he does not defecate normally.  Some urgency/fullness.  Having a bowel movement a day.  Does not want to strain.  Then he feels a few things pop out but nothing felt like it used to be.  No more bleeding.  Not needing narcotics.    Overall he is much happier now.  His girlfriend is pointed out he is sitting normally now and is in good spirits.  Noncompliant on using the Metamucil every day.  He is glad the worst is behind him (Pun somewhat intended).  Patient Active Problem List  Diagnosis  . Obesity, morbid, BMI 50 or higher  . Internal hemorrhoids with pain/swelling/prolapse  . H/O diverticulitis of colon  . Varicocele, left scrotum    Past Medical History  Diagnosis Date  . Asthma   . Back pain   . Diverticulitis of colon Jan 2014    Treated with PO ABx  . Obesity, morbid, BMI 50 or higher 07/04/2012    Past Surgical History  Procedure Laterality Date  . Cholecystectomy  ~2006    [name]   . Wrist surgery      History   Social History  . Marital Status: Single    Spouse Name: N/A    Number of Children: N/A  . Years of Education: N/A   Occupational History  . Not on file.   Social History Main Topics  . Smoking status: Former Smoker -- 0.25 packs/day    Types: Cigarettes    Quit date: 06/03/2012  . Smokeless tobacco: Never Used  . Alcohol Use: Yes     Comment: socially  . Drug Use: No  . Sexually Active: Not on file    Other Topics Concern  . Not on file   Social History Narrative  . No narrative on file    Family History  Problem Relation Age of Onset  . Diabetes Mother     Current Outpatient Prescriptions  Medication Sig Dispense Refill  . hydrocortisone-pramoxine (ANALPRAM-HC) 2.5-1 % rectal cream Place rectally 4 (four) times daily. For irritated and painful hemorrhoids  15 g  2  . witch hazel-glycerin (TUCKS) pad Apply 1 application topically as needed.  40 each     No current facility-administered medications for this visit.     No Known Allergies  BP 140/62  Pulse 83  Temp(Src) 96.3 F (35.7 C) (Temporal)  Resp 18  Ht 5\' 6"  (1.676 m)  Wt 418 lb 6.4 oz (189.785 kg)  BMI 67.56 kg/m2  Ct Pelvis W Contrast  07/04/2012  *RADIOLOGY REPORT*  Clinical Data:  Rectal bleeding and pain.  CT PELVIS WITH CONTRAST  Technique:  Multidetector CT imaging of the pelvis was performed using the standard protocol following the bolus administration of intravenous contrast.  Contrast: OMNIPAQUE IOHEXOL 300 MG/ML  SOLN  Comparison:  CT abdomen and pelvis 06/13/2012.  Findings:  There is some mild stranding about the descending colon which  appears improved since the prior study.  Imaged intrapelvic contents otherwise appear normal.  No perirectal abscess is identified.  No intrapelvic abscess is present.  Visualized small bowel loops are unremarkable. 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  IMPRESSION:  1.  Improved stranding about the descending colon compatible with resolving diverticulitis. 2.  Negative for perirectal abscess. 3.  Bilateral L5 pars particulars defects.   Original Report Authenticated By: Holley Dexter, M.D.      Review of Systems  Constitutional: Negative for fever, chills and diaphoresis.  HENT: Negative for sore throat, trouble swallowing and neck pain.   Eyes: Negative for photophobia and visual disturbance.  Respiratory:  Negative for choking and shortness of breath.   Cardiovascular: Negative for chest pain and palpitations.  Gastrointestinal: Positive for rectal pain. Negative for nausea, vomiting, abdominal pain, diarrhea, constipation, blood in stool, abdominal distention and anal bleeding.  Genitourinary: Negative for dysuria, urgency, difficulty urinating and testicular pain.  Musculoskeletal: Negative for myalgias, arthralgias and gait problem.  Skin: Negative for color change and rash.  Neurological: Negative for dizziness, speech difficulty, weakness and numbness.  Hematological: Negative for adenopathy.  Psychiatric/Behavioral: Negative for hallucinations, confusion and agitation.       Objective:   Physical Exam  Constitutional: He is oriented to person, place, and time. He appears well-developed and well-nourished. No distress.  Apple body habitus.  Superobese.  HENT:  Head: Normocephalic.  Mouth/Throat: Oropharynx is clear and moist. No oropharyngeal exudate.  Eyes: Conjunctivae and EOM are normal. Pupils are equal, round, and reactive to light. No scleral icterus.  Neck: Normal range of motion. No tracheal deviation present.  Cardiovascular: Normal rate, normal heart sounds and intact distal pulses.   Pulmonary/Chest: Effort normal. No respiratory distress.  Abdominal: Soft. He exhibits no distension. There is no tenderness. Hernia confirmed negative in the right inguinal area and confirmed negative in the left inguinal area.  Incisions clean with normal healing ridges.  No hernias  Genitourinary:  Exam done with assistance of male Medical Assistant in the room.  Perianal skin clean with good hygiene.  No pruritis.  No external skin tags / hemorrhoids of significance.  No pilonidal disease.  No fissure.  No abscess/fistula.  Normal sphincter tone.    Digital and anoscopic exam held off due to sensitivity and anxiety.   Musculoskeletal: Normal range of motion. He exhibits no tenderness.   Neurological: He is alert and oriented to person, place, and time. No cranial nerve deficit. He exhibits normal muscle tone. Coordination normal.  Skin: Skin is warm and dry. No rash noted. He is not diaphoretic.  Psychiatric: He has a normal mood and affect. His behavior is normal.       Assessment:     Inflamed hemorrhoids with bleeding and prolapse.  Improving after emergent THD hemorrhoidal ligation and pexy.     Plan:     I am glad he is doing so much better now.  He is in good spirits.  He is being compliant on the fiber supplement.  The anatomy & physiology of the anorectal region was discussed.  The pathophysiology of hemorrhoids and differential diagnosis was discussed.  Natural history progression  was discussed.   I stressed the importance of a bowel regimen to have daily soft bowel movements to minimize progression of disease.   Goal of one BM / day ideal.  Use of wet wipes, warm baths, avoiding straining, etc were emphasized.  Educational handouts further explaining  the pathology, treatment options, and bowel regimen were given as well.   The patient expressed understanding.  Increase activity as tolerated to regular activity.  Do not push through pain.  Diet as tolerated. Bowel regimen to avoid problems.  Return to clinic p.r.n.   Instructions discussed.  Followup with primary care physician for other health issues as would normally be done.  Questions answered.  The patient expressed understanding and appreciation

## 2013-04-10 ENCOUNTER — Emergency Department (HOSPITAL_COMMUNITY)
Admission: EM | Admit: 2013-04-10 | Discharge: 2013-04-10 | Disposition: A | Discharge status: Home / Self Care | Payer: BC Managed Care – PPO | Attending: Emergency Medicine | Admitting: Emergency Medicine

## 2013-04-10 ENCOUNTER — Encounter (HOSPITAL_COMMUNITY): Payer: Self-pay | Admitting: Emergency Medicine

## 2013-04-10 ENCOUNTER — Emergency Department (HOSPITAL_COMMUNITY): Payer: BC Managed Care – PPO

## 2013-04-10 DIAGNOSIS — Z8739 Personal history of other diseases of the musculoskeletal system and connective tissue: Secondary | ICD-10-CM | POA: Insufficient documentation

## 2013-04-10 DIAGNOSIS — Z87891 Personal history of nicotine dependence: Secondary | ICD-10-CM | POA: Insufficient documentation

## 2013-04-10 DIAGNOSIS — R5383 Other fatigue: Secondary | ICD-10-CM

## 2013-04-10 DIAGNOSIS — J45909 Unspecified asthma, uncomplicated: Secondary | ICD-10-CM | POA: Insufficient documentation

## 2013-04-10 DIAGNOSIS — R531 Weakness: Secondary | ICD-10-CM

## 2013-04-10 DIAGNOSIS — R5381 Other malaise: Secondary | ICD-10-CM | POA: Insufficient documentation

## 2013-04-10 DIAGNOSIS — Z8719 Personal history of other diseases of the digestive system: Secondary | ICD-10-CM | POA: Insufficient documentation

## 2013-04-10 DIAGNOSIS — Z792 Long term (current) use of antibiotics: Secondary | ICD-10-CM | POA: Insufficient documentation

## 2013-04-10 DIAGNOSIS — R071 Chest pain on breathing: Secondary | ICD-10-CM | POA: Insufficient documentation

## 2013-04-10 DIAGNOSIS — R0789 Other chest pain: Secondary | ICD-10-CM

## 2013-04-10 DIAGNOSIS — Z6841 Body Mass Index (BMI) 40.0 and over, adult: Secondary | ICD-10-CM | POA: Insufficient documentation

## 2013-04-10 LAB — BASIC METABOLIC PANEL
CO2: 24 mEq/L (ref 19–32)
Chloride: 101 mEq/L (ref 96–112)
GFR calc Af Amer: >90 mL/min (ref >90)
Potassium: 3.5 mEq/L (ref 3.5–5.1)
Sodium: 138 mEq/L (ref 135–145)

## 2013-04-10 LAB — CBC
MCV: 83.4 fL (ref 78.0–100.0)
Platelets: 272 10*3/uL (ref 150–400)
RBC: 5.53 MIL/uL (ref 4.22–5.81)
RDW: 16.2 % — ABNORMAL HIGH (ref 11.5–15.5)
WBC: 7.7 10*3/uL (ref 4.0–10.5)

## 2013-04-10 LAB — POCT I-STAT TROPONIN I

## 2013-04-10 LAB — HEPATIC FUNCTION PANEL
ALT: 43 U/L (ref 0–53)
AST: 34 U/L (ref 0–37)
Albumin: 4.3 g/dL (ref 3.5–5.2)
Alkaline Phosphatase: 104 U/L (ref 39–117)
Total Protein: 8.5 g/dL — ABNORMAL HIGH (ref 6.0–8.3)

## 2013-04-10 MED ORDER — ASPIRIN 325 MG PO TABS
325.0000 mg | ORAL_TABLET | ORAL | Status: AC
  Administered 2013-04-10: 325 mg via ORAL
  Filled 2013-04-10: qty 1

## 2013-04-10 NOTE — ED Notes (Signed)
Pt reports weakness that started yesterday. 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 Chest pain started today.

## 2013-04-10 NOTE — ED Notes (Signed)
Pt states that he began having chest pain at 2000 last night. Pt also c/o weakness, dizziness and nausea. Denies diaphoresis, and vomiting.

## 2013-04-10 NOTE — ED Provider Notes (Signed)
CSN: [id number]     Arrival date & time 04/10/13  0203 History   First MD Initiated Contact with Patient 04/10/13 0246     Chief Complaint  Patient presents with  . Chest Pain   (Consider location/radiation/quality/duration/timing/severity/associated sxs/prior Treatment) HPI 32 year old male presents emergency part from home with complaint of left-sided chest pain, and generalized weakness, and fatigue.  Symptoms have been ongoing for the last 3 weeks.  Patient has past medical history significant for bariatric surgery 3 months ago.  He had gastric sleeve done at [facility].  He reports he was doing well until the beginning of November.  After this time, he has had generalized fatigue, and weakness of both his upper and lower extremities.  Weakness is worse with any activity.  He has contacted his Dr. at [facility] who has recommended that he increase the amount of protein shakes and calories that he is taking in.  Patient has also had 2-3 weeks of sharp, stabbing pains into his left chest.  Symptoms were worse tonight, prompting his visit to the emergency room.  Patient was treated for sore throat about a week ago.  He was placed on antibiotics, and Magic mouthwash.  He reports that he has followup later today for recheck of his sore throat, which has since resolved.  Patient reports that he is taking his vitamins to include B12 as directed.  Patient is very emotional, tearful, and is upset that his postop course is not going as well as it had been.  He denies any shortness of breath, diaphoresis, nausea, vomiting, associated with the chest pain.  Chest pain.  Will last a few seconds.  It is directly under his left nipple.  It has no radiation.  No family history of coronary disease.  Patient has lost 100 pounds since The sleeve put in 3 months ago. Past Medical History  Diagnosis Date  . Asthma   . Back pain   . Diverticulitis of colon Jan 2014    Treated with PO ABx  . Obesity, morbid, BMI 50 or higher  07/04/2012   Past Surgical History  Procedure Laterality Date  . [place]ectomy  ~2006    [name]   . Wrist surgery    . Hemorrhoid surgery     Family History  Problem Relation Age of Onset  . Diabetes Mother    History  Substance Use Topics  . Smoking status: Former Smoker -- 0.25 packs/day    Types: Cigarettes    Quit date: 06/03/2012  . Smokeless tobacco: Former Neurosurgeon  . Alcohol Use: Yes     Comment: socially    Review of Systems  All other systems reviewed and are negative.    Allergies  Review of patient's allergies indicates no known allergies.  Home Medications   Current Outpatient Rx  Name  Route  Sig  Dispense  Refill  . Alum & Mag Hydroxide-Simeth (MAGIC MOUTHWASH W/LIDOCAINE) SOLN   Oral   Take 5 mLs by mouth 4 (four) times daily as needed for mouth pain.         Marland Kitchen amoxicillin (AMOXIL) 500 MG capsule   Oral   Take 500 mg by mouth 3 (three) times daily.          BP 124/61  Pulse 61  Resp 20  Ht 5\' 6"  (1.676 m)  Wt 335 lb (151.955 kg)  BMI 54.10 kg/m2  SpO2 98% Physical Exam  Nursing note and vitals reviewed. Constitutional: He is oriented to person, place, and  time. He appears well-developed and well-nourished. He appears distressed.  HENT:  Head: Normocephalic and atraumatic.  Nose: Nose normal.  Mouth/Throat: Oropharynx is clear and moist.  Eyes: Conjunctivae and EOM are normal. Pupils are equal, round, and reactive to light.  Neck: Normal range of motion. Neck supple. No JVD present. No tracheal deviation present. No thyromegaly present.  Cardiovascular: Normal rate, regular rhythm, normal heart sounds and intact distal pulses.  Exam reveals no gallop and no friction rub.   No murmur heard. Pulmonary/Chest: Effort normal and breath sounds normal. No stridor. No respiratory distress. He has no wheezes. He has no rales. He exhibits tenderness (palpation of left chest wall, and breast tissue reproduces pain.  No overlying skin changes.  No  masses noted).  Abdominal: Soft. Bowel sounds are normal. He exhibits no distension and no mass. There is no tenderness. There is no rebound and no guarding.  Musculoskeletal: Normal range of motion. He exhibits no edema and no tenderness.  Lymphadenopathy:    He has no cervical adenopathy.  Neurological: He is alert and oriented to person, place, and time. He has normal reflexes. No cranial nerve deficit. He exhibits normal muscle tone. Coordination normal.  Skin: Skin is warm and dry. No rash noted. No erythema. No pallor.  Psychiatric: His behavior is normal. Judgment and thought content normal.  Tearful, labile    ED Course  Procedures (including critical care time) Labs Review Labs Reviewed  CBC - Abnormal; Notable for the following:    RDW 16.2 (*)    All other components within normal limits  HEPATIC FUNCTION PANEL - Abnormal; Notable for the following:    Total Protein 8.5 (*)    All other components within normal limits  BASIC METABOLIC PANEL  LIPASE, BLOOD  POCT I-STAT TROPONIN I   Imaging Review Dg Chest Port 1 View  04/10/2013   CLINICAL DATA:  Chest pain, shortness of breath and weakness.  EXAM: PORTABLE CHEST - 1 VIEW  COMPARISON:  Chest radiograph February 15, 2011  FINDINGS: Cardiomediastinal silhouette is unremarkable for this low inspiratory portable examination with crowded vasculature markings. The lungs are clear without pleural effusions or focal consolidations. Trachea projects midline and there is no pneumothorax the lung apices are partially obscured by facial structures. Included soft tissue planes and osseous structures are non-suspicious. Multiple EKG lines overlie the patient and may obscure subtle underlying pathology.  IMPRESSION: No acute cardiopulmonary process.   Electronically Signed   By: Awilda Metro   On: 04/10/2013 03:00    EKG Interpretation     Ventricular Rate:  84 PR Interval:  195 QRS Duration: 78 QT Interval:  353 QTC  Calculation: 417 R Axis:   172 Text Interpretation:  Sinus rhythm limb reversal Baseline wander Baseline wander in lead(s) V1 V2            MDM   1. Generalized weakness   2. Fatigue   3. Chest wall pain    32 year old male with chest wall pain, also, weakness and fatigue.  Over the last 2-3 weeks.  Recommended that patient have B12 level checked.  His cardiac workup is unremarkable.  He is feeling much better after talking about his concerns and has made plans to change back to his previous formulation of B12.  No further symptoms at this time.    Olivia Mackie, MD 04/10/13 [phone]

## 2013-04-10 NOTE — ED Notes (Signed)
EKG given to EDP, Otter, MD. 

## 2014-05-29 ENCOUNTER — Emergency Department (HOSPITAL_COMMUNITY)
Admission: EM | Admit: 2014-05-29 | Discharge: 2014-05-29 | Discharge status: Against Medical Advice | Payer: PRIVATE HEALTH INSURANCE | Attending: Emergency Medicine | Admitting: Emergency Medicine

## 2014-05-29 ENCOUNTER — Encounter (HOSPITAL_COMMUNITY): Payer: Self-pay | Admitting: Emergency Medicine

## 2014-05-29 DIAGNOSIS — N508 Other specified disorders of male genital organs: Secondary | ICD-10-CM | POA: Insufficient documentation

## 2014-05-29 DIAGNOSIS — J45909 Unspecified asthma, uncomplicated: Secondary | ICD-10-CM | POA: Insufficient documentation

## 2014-05-29 NOTE — ED Notes (Signed)
Called 4 times total for patient and looked outside. No evidence of patient.

## 2014-05-29 NOTE — ED Notes (Addendum)
Pt c/o severe testicle pain and swelling x 1 and a half hours.  Pt states that there was no injury, just spontaneous pain.  Hx of epididymitis.

## 2014-06-06 ENCOUNTER — Encounter (HOSPITAL_COMMUNITY): Payer: Self-pay | Admitting: *Deleted

## 2014-06-06 ENCOUNTER — Emergency Department (HOSPITAL_COMMUNITY)
Admission: EM | Admit: 2014-06-06 | Discharge: 2014-06-06 | Disposition: A | Discharge status: Home / Self Care | Payer: PRIVATE HEALTH INSURANCE | Attending: Emergency Medicine | Admitting: Emergency Medicine

## 2014-06-06 DIAGNOSIS — K029 Dental caries, unspecified: Secondary | ICD-10-CM | POA: Insufficient documentation

## 2014-06-06 DIAGNOSIS — K088 Other specified disorders of teeth and supporting structures: Secondary | ICD-10-CM | POA: Insufficient documentation

## 2014-06-06 DIAGNOSIS — Z792 Long term (current) use of antibiotics: Secondary | ICD-10-CM | POA: Insufficient documentation

## 2014-06-06 DIAGNOSIS — K0889 Other specified disorders of teeth and supporting structures: Secondary | ICD-10-CM

## 2014-06-06 DIAGNOSIS — Z87891 Personal history of nicotine dependence: Secondary | ICD-10-CM | POA: Insufficient documentation

## 2014-06-06 DIAGNOSIS — N50812 Left testicular pain: Secondary | ICD-10-CM

## 2014-06-06 DIAGNOSIS — J45909 Unspecified asthma, uncomplicated: Secondary | ICD-10-CM | POA: Insufficient documentation

## 2014-06-06 DIAGNOSIS — N508 Other specified disorders of male genital organs: Secondary | ICD-10-CM | POA: Insufficient documentation

## 2014-06-06 LAB — URINALYSIS, ROUTINE W REFLEX MICROSCOPIC
Bilirubin Urine: NEGATIVE
GLUCOSE, UA: NEGATIVE mg/dL
Hgb urine dipstick: NEGATIVE
Ketones, ur: NEGATIVE mg/dL
Nitrite: NEGATIVE
PROTEIN: NEGATIVE mg/dL
Specific Gravity, Urine: 1.021 (ref 1.005–1.030)
Urobilinogen, UA: 0.2 mg/dL (ref 0.0–1.0)
pH: 6 (ref 5.0–8.0)

## 2014-06-06 LAB — URINE MICROSCOPIC-ADD ON

## 2014-06-06 MED ORDER — NAPROXEN 500 MG PO TABS: 500.0000 mg | ORAL_TABLET | Freq: Two times a day (BID) | ORAL | Status: DC

## 2014-06-06 MED ORDER — CEFTRIAXONE SODIUM 250 MG IJ SOLR
250.0000 mg | Freq: Once | INTRAMUSCULAR | Status: AC
  Administered 2014-06-06: 250 mg via INTRAMUSCULAR
  Filled 2014-06-06: qty 250

## 2014-06-06 MED ORDER — HYDROCODONE-ACETAMINOPHEN 5-325 MG PO TABS: 1.0000 | ORAL_TABLET | ORAL | Status: DC | PRN

## 2014-06-06 MED ORDER — LIDOCAINE HCL (PF) 1 % IJ SOLN
10.0000 mL | Freq: Once | INTRAMUSCULAR | Status: AC
  Administered 2014-06-06: 10 mL
  Filled 2014-06-06: qty 10

## 2014-06-06 MED ORDER — HYDROCODONE-ACETAMINOPHEN 5-325 MG PO TABS
2.0000 | ORAL_TABLET | Freq: Once | ORAL | Status: AC
  Administered 2014-06-06: 2 via ORAL
  Filled 2014-06-06: qty 2

## 2014-06-06 MED ORDER — AZITHROMYCIN 250 MG PO TABS
1000.0000 mg | ORAL_TABLET | Freq: Once | ORAL | Status: AC
  Administered 2014-06-06: 1000 mg via ORAL
  Filled 2014-06-06: qty 4

## 2014-06-06 NOTE — ED Provider Notes (Signed)
CSN: [id number]     Arrival date & time 06/06/14  1538 History   First MD Initiated Contact with Patient 06/06/14 1651     Chief Complaint  Patient presents with  . Dental Pain  . Groin Pain   (Consider location/radiation/quality/duration/timing/severity/associated sxs/prior Treatment) HPI Mark Luna is a 34 yo male presenting with report of pain in his left testicle onset today.  He reports this is a recurrent problem for him and it first started in 2013.  He states there is nothing that seems to cause the pain and it comes and goes randomly.  He denies any injury to his scrotum.  He was evaluated several times by a urologist in 2014 and also had a testicular ultrasound done but was told there was no definite diagnosis for his pain.  The current episode he reports started today and hurts in his left testicle.  He denies any pain in his penis but reports he feels discomfort with urination.  He denies any penile discharge at any time.  He also reports dental pain today after cracking his left lower premolar.  He denies fever, chills or difficulty swallowing.  Past Medical History  Diagnosis Date  . Asthma   . Back pain   . Diverticulitis of colon Jan 2014    Treated with PO ABx  . Obesity, morbid, BMI 50 or higher 07/04/2012   Past Surgical History  Procedure Laterality Date  . Cholecystectomy  ~2006    [name]   . Wrist surgery    . Hemorrhoid surgery     Family History  Problem Relation Age of Onset  . Diabetes Mother    History  Substance Use Topics  . Smoking status: Former Smoker -- 0.25 packs/day    Types: Cigarettes    Quit date: 06/03/2012  . Smokeless tobacco: Former NeurosurgeonUser  . Alcohol Use: Yes     Comment: socially    Review of Systems  Constitutional: Negative for fever and chills.  HENT: Positive for dental problem. Negative for sore throat.   Eyes: Negative for visual disturbance.  Respiratory: Negative for cough and shortness of breath.     Cardiovascular: Negative for chest pain and leg swelling.  Gastrointestinal: Negative for nausea, vomiting and diarrhea.  Genitourinary: Positive for testicular pain. Negative for dysuria, discharge, penile swelling and penile pain.  Musculoskeletal: Negative for myalgias.  Skin: Negative for rash.  Neurological: Negative for weakness, numbness and headaches.      Allergies  Review of patient's allergies indicates no known allergies.  Home Medications   Prior to Admission medications   Medication Sig Start Date End Date Taking? Authorizing Provider  Alum & Mag Hydroxide-Simeth (MAGIC MOUTHWASH W/LIDOCAINE) SOLN Take 5 mLs by mouth 4 (four) times daily as needed for mouth pain.    Historical Provider, MD  amoxicillin (AMOXIL) 500 MG capsule Take 500 mg by mouth 3 (three) times daily.    Historical Provider, MD   BP 132/76 mmHg  Pulse 68  Temp(Src) 98.1 F (36.7 C) (Oral)  Resp 18  SpO2 100% Physical Exam  Constitutional: He is oriented to person, place, and time. He appears well-developed and well-nourished. No distress.  HENT:  Head: Normocephalic and atraumatic.  Mouth/Throat: Oropharynx is clear and moist. No trismus in the jaw. Abnormal dentition. Dental caries present. No dental abscesses or uvula swelling. No oropharyngeal exudate.    Eyes: Conjunctivae are normal.  Neck: Neck supple. No thyromegaly present.  Cardiovascular: Normal rate, regular rhythm and intact distal  pulses.   Pulmonary/Chest: Effort normal and breath sounds normal. No respiratory distress.  Abdominal: Soft. There is no tenderness. Hernia confirmed negative in the right inguinal area and confirmed negative in the left inguinal area.  Genitourinary: Penis normal. Right testis shows no mass, no swelling and no tenderness. Right testis is descended. Cremasteric reflex is not absent on the right side. Left testis shows tenderness. Left testis shows no mass and no swelling. Left testis is descended.  Cremasteric reflex is not absent on the left side.  Musculoskeletal: He exhibits no tenderness.  Lymphadenopathy:    He has no cervical adenopathy.  Neurological: He is alert and oriented to person, place, and time.  Skin: Skin is warm and dry. No rash noted. He is not diaphoretic.  Psychiatric: He has a normal mood and affect.  Nursing note and vitals reviewed.   ED Course  Procedures (including critical care time) Labs Review Labs Reviewed  URINALYSIS, ROUTINE W REFLEX MICROSCOPIC - Abnormal; Notable for the following:    APPearance CLOUDY (*)    Leukocytes, UA MODERATE (*)    All other components within normal limits  GC/CHLAMYDIA PROBE AMP  URINE CULTURE  URINE MICROSCOPIC-ADD ON    Imaging Review No results found.   EKG Interpretation None      MDM   Final diagnoses:  Testicular pain, left  Pain, dental   34 yo with recurrent testicular pain and dental pain.  He has been evaluated for this testicular pain in the past with no definitive findings. I doubt a torsion due to previous negative [place], normal vertical lie and present cremasteric reflex.  Treated in ER with rocephin and azith and NSAIDs. He has been advised to rest, ice and wear scrotal support to help when he has pain. Pt is well-appearing, in no acute distress and vital signs are stable.  They appear safe to be discharged.  Discharge include follow-up with his urologist and dentist.  Return precautions provided. Patient is agreeable to plan.    Filed Vitals:   06/06/14 1558 06/06/14 1904  BP: 132/76 130/70  Pulse: 68 63  Temp: 98.1 F (36.7 C)   TempSrc: Oral   Resp: 18 16  SpO2: 100% 98%    Meds given in ED:  Medications  HYDROcodone-acetaminophen (NORCO/VICODIN) 5-325 MG per tablet 2 tablet (2 tablets Oral Given 06/06/14 1813)  cefTRIAXone (ROCEPHIN) injection 250 mg (250 mg Intramuscular Given 06/06/14 1919)  azithromycin (ZITHROMAX) tablet 1,000 mg (1,000 mg Oral Given 06/06/14 1918)  lidocaine (PF)  (XYLOCAINE) 1 % injection 10 mL (10 mLs Other Given 06/06/14 1919)    Discharge Medication List as of 06/06/2014  7:09 PM    START taking these medications   Details  HYDROcodone-acetaminophen (NORCO/VICODIN) 5-325 MG per tablet Take 1-2 tablets by mouth every 4 (four) hours as needed for moderate pain or severe pain., Starting 06/06/2014, Until Discontinued, Print    naproxen (NAPROSYN) 500 MG tablet Take 1 tablet (500 mg total) by mouth 2 (two) times daily., Starting 06/06/2014, Until Discontinued, Print          Harle Battiest, NP 06/07/14 1327  Raeford Razor, MD 06/08/14 [phone]

## 2014-06-06 NOTE — ED Notes (Signed)
Pt states he has had left testicle pain and occasional testicle swelling for 1 week. Pt states he has had similar symptoms in the past. Pt states he saw a urologistand had an ultrasound performed, which he states was negative. Pt also complains of pain in his lower left premolar.

## 2014-06-06 NOTE — Discharge Instructions (Signed)
Please follow the directions provided.  Use the resources provided to follow-up with the appropriate doctors.  You will need to follow-up with a dentist for this tooth pain and with your urologist for your testicular pain.  Take the naproxen twice a day for pain.  You may take the vicodin for pain not relieved by the naproxen.  It may be helpful to wear a jockstrap or brief style underwear for scrotal support.  Don't hesitate to return for any new, worsening or concerning symptoms.      SEEK IMMEDIATE MEDICAL CARE IF:  You have a fever.  You have pain not relieved with medicines.  You have any worsening of your problems.  Your pain seems to come and go.  You develop pain, redness, and swelling in the scrotum and surrounding areas.   Emergency Department Resource Guide 1) Find a Doctor and Pay Out of Pocket Although you won't have to find out who is covered by your insurance plan, it is a good idea to ask around and get recommendations. You will then need to call the office and see if the doctor you have chosen will accept you as a new patient and what types of options they offer for patients who are self-pay. Some doctors offer discounts or will set up payment plans for their patients who do not have insurance, but you will need to ask so you aren't surprised when you get to your appointment.  2) Contact Your Local Health Department Not all health departments have doctors that can see patients for sick visits, but many do, so it is worth a call to see if yours does. If you don't know where your local health department is, you can check in your phone book. The CDC also has a tool to help you locate your state's health department, and many state websites also have listings of all of their local health departments.  3) Find a Walk-in Clinic If your illness is not likely to be very severe or complicated, you may want to try a walk in clinic. These are popping up all over the country in pharmacies,  drugstores, and shopping centers. They're usually staffed by nurse practitioners or physician assistants that have been trained to treat common illnesses and complaints. They're usually fairly quick and inexpensive. However, if you have serious medical issues or chronic medical problems, these are probably not your best option.  No Primary Care Doctor: - Call Health Connect at  [phone] - they can help you locate a primary care doctor that  accepts your insurance, provides certain services, etc. - Physician Referral Service- [phone]  Chronic Pain Problems: Organization         Address  Phone   Notes  [facility]  [phone] Patients need to be referred by their primary care doctor.   Medication Assistance: Organization         Address  Phone   Notes  [facility] [address] [place], [place] [address] [phone]) [phone] --Must be a resident of [facility] -- Must have NO insurance coverage whatsoever (no Medicaid/ Medicare, etc.) -- The pt. MUST have a primary care doctor that directs their care regularly and follows them in the community   MedAssist  [phone]   [employer]  [phone]    Agencies that provide inexpensive medical care: Organization         Address  Phone   Notes  [facility]  [phone]) [phone]   [facility]    [phone]   [facility] [address] [place], [place] [address] [phone]) [phone]   [facility] [address], [place] [phone]   Planned Parenthood    [phone]) [phone]   [facility]    [phone](336) [phone]   [facility]  [address], [place] Phone:  [phone]) [phone], Fax:  [phone] Hours of Operation:  9 am - 6 pm, M-F.  Also accepts Medicaid/Medicare and self-pay.  [facility]  [address], [place] Phone:  [phone]) [phone], Fax: [phone]. Hours of Operation:  8:30 am - 5:30 pm, M-F.  Also accepts Medicaid and self-pay.  [facility] [place] [address], [place] Phone: [phone]) [phone]   [facility] [address], [place], [place] [phone], Ext. [phone] Mondays & Thursdays: 7-9 AM.  First 15 patients are seen on a first come, first serve basis.    Medicaid-accepting [facility] Providers:  Organization         Address  Phone   Notes  [facility] [address], [place] [phone] Also accepts self-pay patients.  [facility] [address], [place]  [phone]) [phone]   [facility] [address], [place] [phone]) [phone]   [facility] [address] [place] [address], [place] [phone]) [phone]   Renaye RakersVeita Bland [address], [place]   [phone] Only accepts [place] Access [place]icaid patients after they have their name applied to their card.   Self-Pay (no insurance) in [facility] [place]:  Organization         Address  Phone   Notes  Sickle Cell Patients, [facility] [address], [place] [phone]) [phone]   [facility] [address], [place] [phone]   [facility] [place]  [address], [place] [phone]   [facility]/Dr. Osei-Bonsu  [address] [place] [address], [place] or [id number], [place] [phone]) [phone] Phone number for both [place] and [place] locations is the same.  [facility] [address], [place] [phone]) [phone]   [facility] [address] [place] [address], [place] or [address] [phone]) [phone]   [facility] [address], [place] [phone](336) [phone], phone; [phone]) [phone], fax Sees patients 1st and 3rd Saturday of every month.  Must not qualify for public  or private insurance (i.e. Medicaid, Medicare, [place] Health Choice, Veterans' Benefits)  Household income should be no more than 200% of the poverty level The clinic cannot treat you if you are pregnant or think you are pregnant  Sexually transmitted diseases are not treated at the clinic.    Dental Care: Organization         Address  Phone  Notes  [facility] [address], [place] [phone]) [phone] Accepts children up to age 33 who are enrolled in [place]icaid or [place] Health Choice; pregnant women with a Medicaid card; and children who have applied for Medicaid or [place] Health Choice, but were declined, whose parents can pay a reduced fee at time of service.  [facility] Department of [employer]Public [facility]  [place]  [address], [place] [phone] Accepts children up to age 33 who are enrolled in Medicaid or [place] Health Choice; pregnant women with a Medicaid card; and children who have applied for Medicaid or [place] Health Choice, but were declined, whose parents can pay a reduced fee at time of service.  [facility]  [address], [place] [phone]) [phone] Patients are seen by appointment only. Walk-ins are not accepted. [facility] will see patients 33 years of age and older. Monday - Tuesday (8am-5pm) Most Wednesdays (8:30-5pm) $30 per visit, cash only  [facility]  [address], [facility] [phone] Patients are seen by appointment only. Walk-ins are not accepted. [facility] will see patients 33 years of age and older. One Wednesday Evening (Monthly: Volunteer Based).  $30 per visit, cash only  [facility]  [phone] for adults; Children under age 724, call Graduate Pediatric Dentistry at [phone]. Children aged 184-14, please call [phone]) [phone] to request a pediatric application.  Dental services are provided in all areas of  dental care including fillings, crowns and bridges, complete and partial dentures, implants, gum treatment, root canals, and extractions. Preventive care is also provided. Treatment is provided to both adults and children. Patients are selected via a lottery and there is often a waiting list.   [facility] [address], [place]  [phone]) [phone] [email or web address]   [facility] [address], [place], [place] [phone], Ext. [phone] Second and Fourth Thursday of each month, opens at 6:30 AM; Clinic ends at 9 AM.  Patients are seen on a first-come first-served basis, and a limited number are seen during each clinic.   [facility]  [address], [place], [place] [phone]) [phone]   Eligibility Requirements You must have lived in [place], [place], or [place] counties for at least the last three months.   You cannot be eligible for state or federal sponsored National Cityhealthcare insurance, including CIGNAVeterans Administration, [place]icaid, or Har[employer] EntertainmentMedicare.   You generally cannot be eligible for healthcare insurance through your employer.    How to apply: Eligibility screenings are held every Tuesday and Wednesday afternoon from 1:00 pm until 4:00 pm. You do not need an appointment for the interview!  [facility] [address], [place], [place] [phone]   [facility]  [phone]   [facility]  [phone]   [facility]  [phone]    Behavioral Health Resources in the Community: Intensive Outpatient Programs Organization         Address  Phone  Notes  [facility] [address], [place], [place] [phone]   [facility] [address], [place], [place] [phone]   [facility] [address], [place], [place]  [phone]   [facility] [address],  [place], [place] [phone] or  [phone]   Substance Abuse Resources Organization         Address  Phone  Notes  [facility] Drug Services  [phone]   [facility]  [phone]   [facility]  [phone]   [facility]  [phone]   Residential & Outpatient Substance Abuse Program  [phone]   Psychological Services Organization         Address  Phone  Notes  [facility]  [phone]   [facility]  [phone]- [phone]   [facility] [address], [place] [phone] or [phone]    Mobile Crisis Teams Organization         Address  Phone  Notes  [facility], Mobile Crisis Care Unit  [phone]   [facility]  [address] [place], [place] [phone]   Doristine LocksSharon DeEsch [address] [place] [phone]    Self-Help/Support Groups Organization         Address  Phone             Notes  Mental Health Assoc. of [place] - variety of support groups  336- [phone] Call for more information  Narcotics Anonymous (NA), Caring Services [address], [place]  2 meetings at this location   Statisticianesidential Treatment Programs Organization         Address  Phone  Notes  [facility] [address],    [place]  [phone]   [facility]  [address], [place] [phone], [place], [place] [phone]   [facility] [address], [place] [phone] Admissions: 8am-3pm M-F  [facility] [address]    [place], [place] [phone]   [facility] [address], [place], [place] [phone]   [facility] [address]  [place], [place] [phone]   [facility] - Intensive Outpatient [address], [place], [place] [phone]   [address] [facility] (Addiction Recovery [facility] [address]  [place], [place] [phone] or [phone]   Residential  Treatment Services (RTS) [address] [place], [place] [phone] Accepts Medicaid  [facility] [address]  [place] [phone] Substance Abuse/Addiction Treatment   [facility] Behavioral Health Resources Organization         Address  Phone  Notes  [facility]  [phone]) [phone]   Angie FavaJulie Brannon, PhD [address], [address] [place], [place]   [phone](336) [phone] or [phone]   [facility]   [address] [place], [place] [phone](336) [phone]   [facility] [address], [place], [place] [phone]) [phone] Insurance/Medicaid/sponsorship through [facility]  Faith and Families [address]                                    [place], [place] [phone]) [phone] Therapy/tele-psych/case  [facility] [address]   [place], [place] [phone]) [phone]    Dr. Lolly MustacheArfeen  [phone](336) [phone]   [facility] [place]  United [employer] [facility] 1) [address], [place] 2) [address], [place] 3)  [address], [place] [phone](336) [phone]  [phone](336) [phone]   [facility] Child Abuse Hotline [phone](336) [phone] or [phone](336) [phone] (After Hours)

## 2014-06-08 LAB — URINE CULTURE
Colony Count: 15000
Special Requests: NORMAL

## 2014-06-09 LAB — GC/CHLAMYDIA PROBE AMP
CT Probe RNA: NEGATIVE
GC Probe RNA: NEGATIVE

## 2014-08-15 ENCOUNTER — Encounter (HOSPITAL_COMMUNITY): Payer: Self-pay | Admitting: Emergency Medicine

## 2014-08-15 ENCOUNTER — Emergency Department (HOSPITAL_COMMUNITY)
Admission: EM | Admit: 2014-08-15 | Discharge: 2014-08-15 | Disposition: A | Discharge status: Home / Self Care | Payer: BLUE CROSS/BLUE SHIELD | Attending: Emergency Medicine | Admitting: Emergency Medicine

## 2014-08-15 DIAGNOSIS — Z8719 Personal history of other diseases of the digestive system: Secondary | ICD-10-CM | POA: Diagnosis not present

## 2014-08-15 DIAGNOSIS — Z87891 Personal history of nicotine dependence: Secondary | ICD-10-CM | POA: Insufficient documentation

## 2014-08-15 DIAGNOSIS — Z79899 Other long term (current) drug therapy: Secondary | ICD-10-CM | POA: Diagnosis not present

## 2014-08-15 DIAGNOSIS — K088 Other specified disorders of teeth and supporting structures: Secondary | ICD-10-CM | POA: Insufficient documentation

## 2014-08-15 DIAGNOSIS — J45909 Unspecified asthma, uncomplicated: Secondary | ICD-10-CM | POA: Insufficient documentation

## 2014-08-15 DIAGNOSIS — K0889 Other specified disorders of teeth and supporting structures: Secondary | ICD-10-CM

## 2014-08-15 MED ORDER — HYDROCODONE-ACETAMINOPHEN 5-325 MG PO TABS
2.0000 | ORAL_TABLET | Freq: Once | ORAL | Status: AC
  Administered 2014-08-15: 2 via ORAL
  Filled 2014-08-15: qty 2

## 2014-08-15 MED ORDER — PENICILLIN V POTASSIUM 500 MG PO TABS
500.0000 mg | ORAL_TABLET | Freq: Once | ORAL | Status: AC
  Administered 2014-08-15: 500 mg via ORAL
  Filled 2014-08-15: qty 1

## 2014-08-15 MED ORDER — PENICILLIN V POTASSIUM 500 MG PO TABS: 500.0000 mg | ORAL_TABLET | Freq: Four times a day (QID) | ORAL | Status: DC

## 2014-08-15 MED ORDER — HYDROCODONE-ACETAMINOPHEN 5-325 MG PO TABS: 1.0000 | ORAL_TABLET | ORAL | Status: DC | PRN

## 2014-08-15 NOTE — ED Provider Notes (Signed)
CSN: [id number]     Arrival date & time 08/15/14  2144 History   First MD Initiated Contact with Patient 08/15/14 2209     This chart was scribed for non-physician practitioner, Emilia Beck PA-C working with Richardean Canal, MD by Arlan Organ, ED Scribe. This patient was seen in room WTR6/WTR6 and the patient's care was started at 10:43 PM.   Chief Complaint  Patient presents with  . Dental Pain   The history is provided by the patient. No language interpreter was used.    HPI Comments: Mark Luna is a 34 y.o. male with a PMHx of asthma who presents to the Emergency Department complaining of constant, moderate dental pain x 2 days. Pt states pain started after chipping his tooth a few days ago. He also reports pain to gums surround chipped tooth. Mr. Bontempo has tried OTC Tylenol without any improvement for symptoms. Last dose at 1430 today. No recent fever, chills, sore throat, or congestion. He is not currently followed by a dentist. No known allergies to medications.  Past Medical History  Diagnosis Date  . Asthma   . Back pain   . Diverticulitis of colon Jan 2014    Treated with PO ABx  . Obesity, morbid, BMI 50 or higher 07/04/2012   Past Surgical History  Procedure Laterality Date  . Ch[place]cyst[place]  ~2006    [name]   . Wrist surgery    . Hemorrhoid surgery     Family History  Problem Relation Age of Onset  . Diabetes Mother    History  Substance Use Topics  . Smoking status: Former Smoker -- 0.25 packs/day    Types: Cigarettes    Quit date: 06/03/2012  . Smokeless tobacco: Former Neurosurgeon  . Alcohol Use: Yes     Comment: socially    Review of Systems  Constitutional: Negative for fever and chills.  HENT: Positive for dental problem. Negative for congestion.   All other systems reviewed and are negative.     Allergies  Review of patient's allergies indicates no known allergies.  Home Medications   Prior to Admission medications   Medication Sig  Start Date End Date Taking? Authorizing Provider  calcium citrate-vitamin D (CITRACAL+D) 315-200 MG-UNIT per tablet Take 1 tablet by mouth daily.    Historical Provider, MD  Cyanocobalamin (VITAMIN B 12 PO) Take 1 tablet by mouth daily.    Historical Provider, MD  HYDROcodone-acetaminophen (NORCO/VICODIN) 5-325 MG per tablet Take 1-2 tablets by mouth every 4 (four) hours as needed for moderate pain or severe pain. 06/06/14   Harle Battiest, NP  Multiple Vitamin (MULTIVITAMIN WITH MINERALS) TABS tablet Take 2 tablets by mouth daily.    Historical Provider, MD  naproxen (NAPROSYN) 500 MG tablet Take 1 tablet (500 mg total) by mouth 2 (two) times daily. 06/06/14   Harle Battiest, NP   Triage Vitals: BP 147/72 mmHg  Pulse 64  Temp(Src) 97.9 F (36.6 C) (Oral)  Resp 18  SpO2 100%   Physical Exam  Constitutional: He is oriented to person, place, and time. He appears well-developed and well-nourished. No distress.  HENT:  Head: Normocephalic and atraumatic.  Good dentition. Tenderness to percussion of left lower first molar. No abscess.   Eyes: Conjunctivae and EOM are normal.  Neck: Normal range of motion.  Cardiovascular: Normal rate and regular rhythm.  Exam reveals no gallop and no friction rub.   No murmur heard. Pulmonary/Chest: Effort normal and breath sounds normal. He has no wheezes.  He has no rales. He exhibits no tenderness.  Abdominal: Soft. He exhibits no distension. There is no tenderness.  Musculoskeletal: Normal range of motion.  Neurological: He is alert and oriented to person, place, and time.  Speech is goal-oriented. Moves limbs without ataxia.   Skin: Skin is warm and dry.  Psychiatric: He has a normal mood and affect.  Nursing note and vitals reviewed.   ED Course  Procedures (including critical care time)  DIAGNOSTIC STUDIES: Oxygen Saturation is 100% on RA, Normal by my interpretation.    COORDINATION OF CARE: 10:48 PM-Discussed treatment plan with pt at  bedside and pt agreed to plan.     Labs Review Labs Reviewed - No data to display  Imaging Review No results found.   EKG Interpretation None      MDM   Final diagnoses:  Pain, dental    Patient will have Veetid and vicodin for pain. Patient has no abscess or signs of ludwigs angina. Vitals stable and patient afebrile. Patient referred to dentist.   I personally performed the services described in this documentation, which was scribed in my presence. The recorded information has been reviewed and is accurate.   Emilia BeckKaitlyn Arieh Bogue, PA-C 08/15/14 2350  Richardean Canalavid H Yao, MD 08/16/14 [phone]

## 2014-08-15 NOTE — ED Notes (Signed)
Pt reports eating on Friday night and "I chipped off a tooth on my left lower side. Now my gums are red and swolled up. It feels like my cheek is about to pop. It feels like it's rising up. It's hurting really bad. I think it's infected. I've been nauseous." Denies fevers. No other c/c. Took Tylenol around 1430 with no alleviation of pain.

## 2014-08-15 NOTE — Discharge Instructions (Signed)
Take Veetid as directed until gone. Take Vicodin as needed for pain. Refer to attached documents for more information. Return to the ED with worsening or concerning symptoms. Follow up with Dr. Leanord AsalFarless for further evaluation.

## 2015-09-04 ENCOUNTER — Emergency Department (HOSPITAL_COMMUNITY)
Admission: EM | Admit: 2015-09-04 | Discharge: 2015-09-04 | Disposition: A | Discharge status: Home / Self Care | Payer: BLUE CROSS/BLUE SHIELD | Attending: Emergency Medicine | Admitting: Emergency Medicine

## 2015-09-04 ENCOUNTER — Encounter (HOSPITAL_COMMUNITY): Payer: Self-pay | Admitting: Emergency Medicine

## 2015-09-04 DIAGNOSIS — S3991XA Unspecified injury of abdomen, initial encounter: Secondary | ICD-10-CM | POA: Insufficient documentation

## 2015-09-04 DIAGNOSIS — Z8719 Personal history of other diseases of the digestive system: Secondary | ICD-10-CM | POA: Diagnosis not present

## 2015-09-04 DIAGNOSIS — W010XXA Fall on same level from slipping, tripping and stumbling without subsequent striking against object, initial encounter: Secondary | ICD-10-CM | POA: Insufficient documentation

## 2015-09-04 DIAGNOSIS — J45909 Unspecified asthma, uncomplicated: Secondary | ICD-10-CM | POA: Diagnosis not present

## 2015-09-04 DIAGNOSIS — Z791 Long term (current) use of non-steroidal anti-inflammatories (NSAID): Secondary | ICD-10-CM | POA: Insufficient documentation

## 2015-09-04 DIAGNOSIS — Y9389 Activity, other specified: Secondary | ICD-10-CM | POA: Insufficient documentation

## 2015-09-04 DIAGNOSIS — Y998 Other external cause status: Secondary | ICD-10-CM | POA: Diagnosis not present

## 2015-09-04 DIAGNOSIS — S3992XA Unspecified injury of lower back, initial encounter: Secondary | ICD-10-CM | POA: Diagnosis present

## 2015-09-04 DIAGNOSIS — Z792 Long term (current) use of antibiotics: Secondary | ICD-10-CM | POA: Insufficient documentation

## 2015-09-04 DIAGNOSIS — F17210 Nicotine dependence, cigarettes, uncomplicated: Secondary | ICD-10-CM | POA: Diagnosis not present

## 2015-09-04 DIAGNOSIS — M5442 Lumbago with sciatica, left side: Secondary | ICD-10-CM | POA: Insufficient documentation

## 2015-09-04 DIAGNOSIS — Z79899 Other long term (current) drug therapy: Secondary | ICD-10-CM | POA: Insufficient documentation

## 2015-09-04 DIAGNOSIS — Y9289 Other specified places as the place of occurrence of the external cause: Secondary | ICD-10-CM | POA: Insufficient documentation

## 2015-09-04 NOTE — ED Provider Notes (Signed)
CSN: [id number]     Arrival date & time 09/04/15  1626 History  By signing my name below, I, Mark Luna, attest that this documentation has been prepared under the direction and in the presence of Langston MaskerKaren Melva Faux, PA-C Electronically Signed: [name], ED Scribe. 09/04/2015. 5:19 PM.    Chief Complaint  Patient presents with  . Back Pain      The history is provided by the patient. No language interpreter was used.    HPI Comments: Mark DallasJustin L Luna is a 35 y.o. male with a medical hx of back pain who presents to the Emergency Department complaining of right lower back pain onset PTA. Pt notes that he slipped and fell on wet floor while at work and has had lower back pain since the fall. Pt states that he slipped and fell onto to his left side. Pt denies twisting his back at the time of the incident. Pt notes that he sees Dr. Otelia SergeantNitka at [facility] due to him falling at his other job. Pt currently has Rx for lyrica and oxycodone prescribed to him his orthopedist. Pt next appointment is next Thursday. Pt was informed to come to the ED through his jobs worker's comp representative. He states that he is having associated symptoms of left side pain and resolved nausea. He states that he has tried oxycodone with his last dose at 2:30 PM for the relief for his symptoms. Pt denies hitting his head, LOC, gait problem, and any other symptoms. Pt reports that he didn't drive to the ED and that he was dropped off.   Past Medical History  Diagnosis Date  . Asthma   . Back pain   . Diverticulitis of colon Jan 2014    Treated with PO ABx  . Obesity, morbid, BMI 50 or higher (HCC) 07/04/2012   Past Surgical History  Procedure Laterality Date  . [place]  [place]2006    [name]   . Wrist surgery    . Hemorrhoid surgery     Family History  Problem Relation Age of Onset  . Diabetes Mother    Social History  Substance Use Topics  . Smoking status: Current Every Day Smoker -- 0.25  packs/day    Types: Cigarettes  . Smokeless tobacco: Former NeurosurgeonUser  . Alcohol Use: Yes     Comment: socially    Review of Systems  Musculoskeletal: Positive for back pain. Negative for gait problem.  Skin: Negative for color change, rash and wound.  Neurological: Negative for syncope.  All other systems reviewed and are negative.     Allergies  Review of patient's allergies indicates no known allergies.  Home Medications   Prior to Admission medications   Medication Sig Start Date End Date Taking? Authorizing Provider  calcium citrate-vitamin D (CITRACAL+D) 315-200 MG-UNIT per tablet Take 1 tablet by mouth daily.    Historical Provider, MD  Cyanocobalamin (VITAMIN B 12 PO) Take 1 tablet by mouth daily.    Historical Provider, MD  HYDROcodone-acetaminophen (NORCO/VICODIN) 5-325 MG per tablet Take 1-2 tablets by mouth every 4 (four) hours as needed. 08/15/14   Emilia BeckKaitlyn Szekalski, PA-C  Multiple Vitamin (MULTIVITAMIN WITH MINERALS) TABS tablet Take 2 tablets by mouth daily.    Historical Provider, MD  naproxen (NAPROSYN) 500 MG tablet Take 1 tablet (500 mg total) by mouth 2 (two) times daily. 06/06/14   Harle BattiestElizabeth Tysinger, NP  penicillin v potassium (VEETID) 500 MG tablet Take 1 tablet (500 mg total) by mouth 4 (four) times daily.  08/15/14   Kaitlyn Szekalski, PA-C   BP 131/59 mmHg  Pulse 61  Temp(Src) 97.6 F (36.4 C) (Oral)  Resp 16  SpO2 98% Physical Exam  Constitutional: He is oriented to person, place, and time. He appears well-developed and well-nourished. No distress.  HENT:  Head: Normocephalic and atraumatic.  Eyes: EOM are normal.  Neck: Neck supple.  Cardiovascular: Normal rate.   Pulmonary/Chest: Effort normal. No respiratory distress.  Musculoskeletal: Normal range of motion.  FROM left elbow. No bruising. FROM left knee. Diffusely tender left flank. FROM. Pt is able to ambulate without difficulty.  Neurological: He is alert and oriented to person, place, and time.   Skin: Skin is warm and dry.  Psychiatric: He has a normal mood and affect. His behavior is normal.  Nursing note and vitals reviewed.   ED Course  Procedures (including critical care time) DIAGNOSTIC STUDIES: Oxygen Saturation is 98% on RA, nl by my interpretation.    COORDINATION OF CARE: 5:19 PM Discussed treatment plan with pt at bedside and pt agreed to plan.    Labs Review Labs Reviewed - No data to display  Imaging Review No results found.    EKG Interpretation None      MDM  I doubt any fractures.  Pt does not feel like he has any broken bones    Final diagnoses:  Left-sided low back pain with left-sided sciatica   Pt seems sedated, admits to taking pain medications before coming in. Pt is followed by Dr. Otelia Sergeant for chronic pain.  An After Visit Summary was printed and given to the patient.   Lonia Skinner [place], PA-C 09/04/15 1841  Lorre Nick, MD 09/08/15 [phone]

## 2015-09-04 NOTE — Discharge Instructions (Signed)

## 2015-09-04 NOTE — ED Notes (Signed)
Pt states he fell earlier today at work, slipping on a wet floor. Pt states he landed on his left side, and hit his head on a wall. No bump is felt in his head. Pt denies LOC. Pt states his back is currently hurting him. He describes it as a sharp pain. Pt is able to ambulate

## 2015-09-04 NOTE — ED Notes (Signed)
Pt complaint of right lower back pain post fall/slipping on wet floor; hx of chronic lower back pain; pain worse post fall. Pt reports taking oxycodone at 1430 post fall to treat pain.

## 2015-09-06 ENCOUNTER — Emergency Department (HOSPITAL_COMMUNITY)
Admission: EM | Admit: 2015-09-06 | Discharge: 2015-09-06 | Disposition: A | Discharge status: Home / Self Care | Payer: Worker's Compensation | Attending: Emergency Medicine | Admitting: Emergency Medicine

## 2015-09-06 ENCOUNTER — Emergency Department (HOSPITAL_COMMUNITY): Payer: Worker's Compensation

## 2015-09-06 ENCOUNTER — Encounter (HOSPITAL_COMMUNITY): Payer: Self-pay | Admitting: Emergency Medicine

## 2015-09-06 DIAGNOSIS — Z791 Long term (current) use of non-steroidal anti-inflammatories (NSAID): Secondary | ICD-10-CM | POA: Insufficient documentation

## 2015-09-06 DIAGNOSIS — Z79899 Other long term (current) drug therapy: Secondary | ICD-10-CM | POA: Insufficient documentation

## 2015-09-06 DIAGNOSIS — F17210 Nicotine dependence, cigarettes, uncomplicated: Secondary | ICD-10-CM | POA: Insufficient documentation

## 2015-09-06 DIAGNOSIS — Z792 Long term (current) use of antibiotics: Secondary | ICD-10-CM | POA: Insufficient documentation

## 2015-09-06 DIAGNOSIS — Y9289 Other specified places as the place of occurrence of the external cause: Secondary | ICD-10-CM | POA: Insufficient documentation

## 2015-09-06 DIAGNOSIS — Z8719 Personal history of other diseases of the digestive system: Secondary | ICD-10-CM | POA: Insufficient documentation

## 2015-09-06 DIAGNOSIS — R1084 Generalized abdominal pain: Secondary | ICD-10-CM

## 2015-09-06 DIAGNOSIS — W1839XA Other fall on same level, initial encounter: Secondary | ICD-10-CM | POA: Insufficient documentation

## 2015-09-06 DIAGNOSIS — Z9049 Acquired absence of other specified parts of digestive tract: Secondary | ICD-10-CM | POA: Insufficient documentation

## 2015-09-06 DIAGNOSIS — S3991XA Unspecified injury of abdomen, initial encounter: Secondary | ICD-10-CM | POA: Insufficient documentation

## 2015-09-06 DIAGNOSIS — S3992XA Unspecified injury of lower back, initial encounter: Secondary | ICD-10-CM | POA: Insufficient documentation

## 2015-09-06 DIAGNOSIS — Z91040 Latex allergy status: Secondary | ICD-10-CM | POA: Insufficient documentation

## 2015-09-06 DIAGNOSIS — J45909 Unspecified asthma, uncomplicated: Secondary | ICD-10-CM | POA: Insufficient documentation

## 2015-09-06 DIAGNOSIS — Y998 Other external cause status: Secondary | ICD-10-CM | POA: Insufficient documentation

## 2015-09-06 DIAGNOSIS — G8929 Other chronic pain: Secondary | ICD-10-CM | POA: Insufficient documentation

## 2015-09-06 DIAGNOSIS — Y9389 Activity, other specified: Secondary | ICD-10-CM | POA: Insufficient documentation

## 2015-09-06 LAB — CBC
HEMATOCRIT: 38.8 % — AB (ref 39.0–52.0)
HEMOGLOBIN: 12.1 g/dL — AB (ref 13.0–17.0)
MCH: 23.1 pg — ABNORMAL LOW (ref 26.0–34.0)
MCHC: 31.2 g/dL (ref 30.0–36.0)
MCV: 74 fL — AB (ref 78.0–100.0)
Platelets: 335 10*3/uL (ref 150–400)
RBC: 5.24 MIL/uL (ref 4.22–5.81)
RDW: 16.5 % — AB (ref 11.5–15.5)
WBC: 4.6 10*3/uL (ref 4.0–10.5)

## 2015-09-06 LAB — COMPREHENSIVE METABOLIC PANEL
ALK PHOS: 65 U/L (ref 38–126)
ALT: 26 U/L (ref 17–63)
ANION GAP: 7 (ref 5–15)
AST: 29 U/L (ref 15–41)
Albumin: 4.1 g/dL (ref 3.5–5.0)
BILIRUBIN TOTAL: 0.3 mg/dL (ref 0.3–1.2)
BUN: 14 mg/dL (ref 6–20)
CALCIUM: 9.3 mg/dL (ref 8.9–10.3)
CO2: 24 mmol/L (ref 22–32)
Chloride: 109 mmol/L (ref 101–111)
Creatinine, Ser: 0.96 mg/dL (ref 0.61–1.24)
GFR calc Af Amer: >60 mL/min (ref >60)
GLUCOSE: 102 mg/dL — AB (ref 65–99)
Potassium: 4.2 mmol/L (ref 3.5–5.1)
Sodium: 140 mmol/L (ref 135–145)
TOTAL PROTEIN: 7.7 g/dL (ref 6.5–8.1)

## 2015-09-06 LAB — URINALYSIS, ROUTINE W REFLEX MICROSCOPIC
BILIRUBIN URINE: NEGATIVE
GLUCOSE, UA: NEGATIVE mg/dL
Hgb urine dipstick: NEGATIVE
KETONES UR: NEGATIVE mg/dL
Leukocytes, UA: NEGATIVE
Nitrite: NEGATIVE
PH: 7.5 (ref 5.0–8.0)
Protein, ur: NEGATIVE mg/dL
Specific Gravity, Urine: 1.03 (ref 1.005–1.030)

## 2015-09-06 LAB — LIPASE, BLOOD: Lipase: 30 U/L (ref 11–51)

## 2015-09-06 MED ORDER — ONDANSETRON 4 MG PO TBDP: 4.0000 mg | ORAL_TABLET | Freq: Three times a day (TID) | ORAL | Status: DC | PRN

## 2015-09-06 MED ORDER — ONDANSETRON HCL 4 MG/2ML IJ SOLN
4.0000 mg | Freq: Once | INTRAMUSCULAR | Status: AC
  Administered 2015-09-06: 4 mg via INTRAVENOUS
  Filled 2015-09-06: qty 2

## 2015-09-06 MED ORDER — IOPAMIDOL (ISOVUE-300) INJECTION 61%
100.0000 mL | Freq: Once | INTRAVENOUS | Status: AC | PRN
  Administered 2015-09-06: 100 mL via INTRAVENOUS

## 2015-09-06 MED ORDER — SODIUM CHLORIDE 0.9 % IV BOLUS (SEPSIS)
1000.0000 mL | Freq: Once | INTRAVENOUS | Status: AC
  Administered 2015-09-06: 1000 mL via INTRAVENOUS

## 2015-09-06 MED ORDER — NAPROXEN 500 MG PO TABS: 500.0000 mg | ORAL_TABLET | Freq: Two times a day (BID) | ORAL | Status: DC

## 2015-09-06 MED ORDER — HYDROMORPHONE HCL 1 MG/ML IJ SOLN
1.0000 mg | Freq: Once | INTRAMUSCULAR | Status: AC
  Administered 2015-09-06: 1 mg via INTRAVENOUS
  Filled 2015-09-06: qty 1

## 2015-09-06 NOTE — Discharge Instructions (Signed)
Please return to the ER if your symptoms worsen; you have increased pain, fevers, chills, inability to keep any medications down, confusion. Otherwise see the outpatient doctor as requested.   Abdominal Pain, Adult Many things can cause abdominal pain. Usually, abdominal pain is not caused by a disease and will improve without treatment. It can often be observed and treated at home. Your health care provider will do a physical exam and possibly order blood tests and X-rays to help determine the seriousness of your pain. However, in many cases, more time must pass before a clear cause of the pain can be found. Before that point, your health care provider may not know if you need more testing or further treatment. HOME CARE INSTRUCTIONS Monitor your abdominal pain for any changes. The following actions may help to alleviate any discomfort you are experiencing:  Only take over-the-counter or prescription medicines as directed by your health care provider.  Do not take laxatives unless directed to do so by your health care provider.  Try a clear liquid diet (broth, tea, or water) as directed by your health care provider. Slowly move to a bland diet as tolerated. SEEK MEDICAL CARE IF:  You have unexplained abdominal pain.  You have abdominal pain associated with nausea or diarrhea.  You have pain when you urinate or have a bowel movement.  You experience abdominal pain that wakes you in the night.  You have abdominal pain that is worsened or improved by eating food.  You have abdominal pain that is worsened with eating fatty foods.  You have a fever. SEEK IMMEDIATE MEDICAL CARE IF:  Your pain does not go away within 2 hours.  You keep throwing up (vomiting).  Your pain is felt only in portions of the abdomen, such as the right side or the left lower portion of the abdomen.  You pass bloody or black tarry stools. MAKE SURE YOU:  Understand these instructions.  Will watch your  condition.  Will get help right away if you are not doing well or get worse.   This information is not intended to replace advice given to you by your health care provider. Make sure you discuss any questions you have with your health care provider.   Document Released: 02/22/2005 Document Revised: 02/03/2015 Document Reviewed: 01/22/2013 Elsevier Interactive Patient Education [employer]2016 Elsevier Inc.

## 2015-09-06 NOTE — ED Provider Notes (Addendum)
CSN: [id number]     Arrival date & time 09/06/15  0801 History   First MD Initiated Contact with Patient 09/06/15 0825     Chief Complaint  Patient presents with  . Abdominal Pain  . Back Pain     (Consider location/radiation/quality/duration/timing/severity/associated sxs/prior Treatment) HPI Comments: Pt comes in with cc of fall. Pt has chronic back pain. Hx of cholecystectomy. Pt had a slip and fall on Saturday. Pt fell on his L side. Pt had pain on his side (L), back, elbow, knee. Last night pt started having abd pain, and the pain has been constant since then, and this AM his back pain is worse. Abd pain is described as generalized, throbbing, worst on the R side. Back pain is bilateral flank pain and midback pain. Pain in the back is sharp.  No dysuria, hematuria, polyuria. No diarrhea. + emesis x 1 today. With back pain, no associated numbness, weakness, urinary incontinence, urinary retention, bowel incontinence, saddle anesthesia.   ROS 10 Systems reviewed and are negative for acute change except as noted in the HPI.       Patient is a 34 y.o. male presenting with abdominal pain and back pain. The history is provided by the patient.  Abdominal Pain Back Pain Associated symptoms: abdominal pain     Past Medical History  Diagnosis Date  . Asthma   . Back pain   . Diverticulitis of colon Jan 2014    Treated with PO ABx  . Obesity, morbid, BMI 50 or higher (HCC) 07/04/2012   Past Surgical History  Procedure Laterality Date  . [place]  ~2006    [name]   . Wrist surgery    . Hemorrhoid surgery     Family History  Problem Relation Age of Onset  . Diabetes Mother    Social History  Substance Use Topics  . Smoking status: Current Every Day Smoker -- 0.25 packs/day    Types: Cigarettes  . Smokeless tobacco: Former Neurosurgeon  . Alcohol Use: Yes     Comment: socially    Review of Systems  Gastrointestinal: Positive for abdominal pain.   Musculoskeletal: Positive for back pain.      Allergies  Latex  Home Medications   Prior to Admission medications   Medication Sig Start Date End Date Taking? Authorizing Provider  calcium citrate-vitamin D (CITRACAL+D) 315-200 MG-UNIT per tablet Take 1 tablet by mouth daily.   Yes Historical Provider, MD  Cyanocobalamin (VITAMIN B 12 PO) Take 1 tablet by mouth daily.   Yes Historical Provider, MD  Iron TABS Take 1 tablet by mouth 2 (two) times a week.   Yes Historical Provider, MD  Melatonin 5 MG TABS Take 5 mg by mouth every other day.   Yes Historical Provider, MD  Multiple Vitamin (MULTIVITAMIN WITH MINERALS) TABS tablet Take 1-2 tablets by mouth 2 (two) times daily. Take 2 tablets in the morning and 1 tablet in the evening   Yes Historical Provider, MD  oxyCODONE-acetaminophen (PERCOCET) 7.5-325 MG tablet Take 0.5-1 tablets by mouth every 4 (four) hours as needed for moderate pain.  07/09/15  Yes Historical Provider, MD  pregabalin (LYRICA) 75 MG capsule Take 150 mg by mouth 2 (two) times daily as needed (nerve pain).   Yes Historical Provider, MD  naproxen (NAPROSYN) 500 MG tablet Take 1 tablet (500 mg total) by mouth 2 (two) times daily. 09/06/15   Derwood Kaplan, MD  ondansetron (ZOFRAN ODT) 4 MG disintegrating tablet Take 1 tablet (4 mg total)  by mouth every 8 (eight) hours as needed for nausea or vomiting. 09/06/15   Eryanna Regal, MD   BP 115/65 mmHg  Pulse 58  Temp(Src) 98 F (36.7 C) (Oral)  Resp 16  SpO2 100% Physical Exam  Constitutional: He is oriented to person, place, and time. He appears well-developed.  HENT:  Head: Normocephalic and atraumatic.  Eyes: Conjunctivae and EOM are normal. Pupils are equal, round, and reactive to light.  Neck: Normal range of motion. Neck supple.  Cardiovascular: Normal rate, regular rhythm and normal heart sounds.   Pulmonary/Chest: Effort normal and breath sounds normal. No respiratory distress. He has no wheezes.  Abdominal:  Soft. Bowel sounds are normal. He exhibits no distension. There is tenderness. There is guarding. There is no rebound.  Generalized tenderness, voluntary guarding  Musculoskeletal:  Pt has tenderness over the lumbar region No step offs, no erythema. Able to discriminate between sharp and dull over the lower extremity and strength is 4+ and equal Able to ambulate   Neurological: He is alert and oriented to person, place, and time.  Skin: Skin is warm.  Nursing note and vitals reviewed.   ED Course  Procedures (including critical care time) Labs Review Labs Reviewed  COMPREHENSIVE METABOLIC PANEL - Abnormal; Notable for the following:    Glucose, Bld 102 (*)    All other components within normal limits  CBC - Abnormal; Notable for the following:    Hemoglobin 12.1 (*)    HCT 38.8 (*)    MCV 74.0 (*)    MCH 23.1 (*)    RDW 16.5 (*)    All other components within normal limits  URINALYSIS, ROUTINE W REFLEX MICROSCOPIC (NOT AT [facility]) - Abnormal; Notable for the following:    APPearance CLOUDY (*)    All other components within normal limits  LIPASE, BLOOD    Imaging Review Ct Abdomen Pelvis W Contrast  09/06/2015  CLINICAL DATA:  Larey Seat on wet floor 09/04/2015. Left-sided abdominal tenderness and left-sided low back pain. Initial encounter. EXAM: CT ABDOMEN AND PELVIS WITH CONTRAST TECHNIQUE: Multidetector CT imaging of the abdomen and pelvis was performed using the standard protocol following bolus administration of intravenous contrast. CONTRAST:  ISOVUE-300 IOPAMIDOL (ISOVUE-300) INJECTION 61% COMPARISON:  06/13/2012 FINDINGS: The visualized lung bases are free of consolidation and effusion. The gallbladder is surgically absent. Mild extrahepatic biliary dilatation is unchanged and likely related to prior cholecystectomy. The liver is enlarged, measuring 20 cm in length. The spleen, adrenal glands, kidneys, and pancreas are unremarkable. 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 There is no evidence of bowel obstruction. Scattered colonic diverticula are noted without evidence of diverticulitis. The appendix is unremarkable. Bladder and prostate are unremarkable. No free fluid or enlarged lymph nodes are identified. Aorta is normal in caliber. 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 No acute osseous abnormality is identified. IMPRESSION: No acute abnormality identified in the abdomen or pelvis. Electronically Signed   By: Sebastian Ache M.D.   On: 09/06/2015 10:34   I have personally reviewed and evaluated these images and lab results as part of my medical decision-making.   EKG Interpretation None      MDM   Final diagnoses:  Generalized abdominal pain    Pt comes in with cc of abd pain and back pain  Back pain - hx of chronic pain and a recent fall. Fall might have exaggerated the symptoms. No red flags suggesting cord  compression.  Abd: Generalized tenderness, with nausea and emesis x 1. Hx of cholecystectomy. Pain gradual and getting worse. Non focal. No rebound. DDX: SBO, gastroenteritis. Internal bleeding thought less likely at this point (recent fall), as VSS. CT abdomen ordered due to the severity of pain.  Derwood KaplanAnkit Elasha Tess, MD 09/06/15 0915  12:39 PM Ct is neg. Pt informed RN he is comfortable to go - and so i hav asked her to share the results. PO challenge passed on oral challenge going well.  Derwood KaplanAnkit Zahmir Lalla, MD 09/06/15 1240

## 2015-09-06 NOTE — ED Notes (Signed)
Patient transported to CT 

## 2015-09-06 NOTE — ED Notes (Signed)
Larey SeatFell Saturday, was seen here for it. States the back pain has gotten worse and now it's having abdominal pain. C/o bilateral flank pain and right sided abdominal pain since fall. Pt states he's dry heaved without vomiting, is nauseous.

## 2015-09-27 ENCOUNTER — Encounter (HOSPITAL_COMMUNITY): Payer: Self-pay | Admitting: Emergency Medicine

## 2015-09-27 ENCOUNTER — Emergency Department (HOSPITAL_COMMUNITY)
Admission: EM | Admit: 2015-09-27 | Discharge: 2015-09-28 | Disposition: A | Discharge status: Home / Self Care | Payer: BLUE CROSS/BLUE SHIELD | Attending: Emergency Medicine | Admitting: Emergency Medicine

## 2015-09-27 DIAGNOSIS — J45909 Unspecified asthma, uncomplicated: Secondary | ICD-10-CM | POA: Insufficient documentation

## 2015-09-27 DIAGNOSIS — G43909 Migraine, unspecified, not intractable, without status migrainosus: Secondary | ICD-10-CM | POA: Insufficient documentation

## 2015-09-27 DIAGNOSIS — F17210 Nicotine dependence, cigarettes, uncomplicated: Secondary | ICD-10-CM | POA: Diagnosis not present

## 2015-09-27 DIAGNOSIS — R51 Headache: Secondary | ICD-10-CM | POA: Diagnosis present

## 2015-09-27 NOTE — ED Notes (Signed)
Neuro intact.

## 2015-09-27 NOTE — ED Notes (Signed)
Pt reports headache symptoms starting 2 days ago.  Pt has tried multiple medications to help with no relief.  Last medication taken was sudafed (2 tablets) at 1830.  Pt reports increasing pressure around his eyes, a pounding feeling, dizziness, and unable to tolerate light.

## 2015-09-28 NOTE — ED Notes (Signed)
Pt eloped from waiting room

## 2015-09-28 NOTE — ED Notes (Signed)
Bed: WA05 Expected date:  Expected time:  Means of arrival:  Comments: 

## 2015-10-19 ENCOUNTER — Ambulatory Visit: Payer: PRIVATE HEALTH INSURANCE | Admitting: Physical Therapy

## 2016-03-14 ENCOUNTER — Encounter (INDEPENDENT_AMBULATORY_CARE_PROVIDER_SITE_OTHER): Payer: Worker's Compensation | Admitting: Physical Medicine and Rehabilitation

## 2016-03-14 DIAGNOSIS — M5416 Radiculopathy, lumbar region: Secondary | ICD-10-CM

## 2016-03-16 ENCOUNTER — Ambulatory Visit (INDEPENDENT_AMBULATORY_CARE_PROVIDER_SITE_OTHER): Payer: Worker's Compensation | Admitting: Specialist

## 2016-03-16 DIAGNOSIS — M5416 Radiculopathy, lumbar region: Secondary | ICD-10-CM | POA: Diagnosis not present

## 2016-07-21 ENCOUNTER — Ambulatory Visit (HOSPITAL_COMMUNITY)
Admission: EM | Admit: 2016-07-21 | Discharge: 2016-07-21 | Disposition: A | Discharge status: Home / Self Care | Payer: BLUE CROSS/BLUE SHIELD | Attending: Family Medicine | Admitting: Family Medicine

## 2016-07-21 ENCOUNTER — Encounter (HOSPITAL_COMMUNITY): Payer: Self-pay

## 2016-07-21 DIAGNOSIS — R0789 Other chest pain: Secondary | ICD-10-CM

## 2016-07-21 MED ORDER — MELOXICAM 7.5 MG PO TABS: 7.5000 mg | ORAL_TABLET | Freq: Two times a day (BID) | ORAL | 1 refills | Status: DC

## 2016-07-21 NOTE — ED Provider Notes (Signed)
[facility]    CSN: [id number] Arrival date & time: 07/21/16  1830     History   Chief Complaint Chief Complaint  Patient presents with  . Chest Pain    HPI Mark Luna is a 36 y.o. male.   The history is provided by the patient.  Chest Pain  Pain location:  L lateral chest Pain quality: sharp and stabbing   Pain radiates to:  Does not radiate Pain severity:  Moderate Onset quality:  Gradual Duration:  1 day Progression:  Unchanged Chronicity:  New Relieved by:  Nothing Worsened by:  Certain positions and movement Ineffective treatments:  None tried Associated symptoms: anxiety   Associated symptoms: no fever, no palpitations and no shortness of breath     Past Medical History:  Diagnosis Date  . Asthma   . Back pain   . Diverticulitis of colon Jan 2014   Treated with PO ABx  . Obesity, morbid, BMI 50 or higher (HCC) 07/04/2012    Patient Active Problem List   Diagnosis Date Noted  . Obesity, morbid, BMI 50 or higher (HCC) 07/04/2012  . Internal hemorrhoids with pain/swelling/prolapse 07/04/2012  . H/O diverticulitis of colon 07/04/2012  . Varicocele, left scrotum 07/04/2012    Past Surgical History:  Procedure Laterality Date  . CHOLE[place]OMY  ~2006   [name]   . GASTRIC BYPASS  2014  . HEMORRHOID SURGERY    . WRIST SURGERY         Home Medications    Prior to Admission medications   Medication Sig Start Date End Date Taking? Authorizing Provider  calcium citrate-vitamin D (CITRACAL+D) 315-200 MG-UNIT per tablet Take 1 tablet by mouth daily.    Historical Provider, MD  Cyanocobalamin (VITAMIN B 12 PO) Take 1 tablet by mouth daily.    Historical Provider, MD  Iron TABS Take 1 tablet by mouth 2 (two) times a week.    Historical Provider, MD  Melatonin 5 MG TABS Take 5 mg by mouth every other day.    Historical Provider, MD  meloxicam (MOBIC) 7.5 MG tablet Take 1 tablet (7.5 mg total) by mouth 2 (two) times daily after a  meal. 07/21/16   Linna HoffJames D Jenaveve Fenstermaker, MD  Multiple Vitamin (MULTIVITAMIN WITH MINERALS) TABS tablet Take 1-2 tablets by mouth 2 (two) times daily. Take 2 tablets in the morning and 1 tablet in the evening    Historical Provider, MD  naproxen (NAPROSYN) 500 MG tablet Take 1 tablet (500 mg total) by mouth 2 (two) times daily. 09/06/15   Derwood KaplanAnkit Nanavati, MD  ondansetron (ZOFRAN ODT) 4 MG disintegrating tablet Take 1 tablet (4 mg total) by mouth every 8 (eight) hours as needed for nausea or vomiting. 09/06/15   Derwood KaplanAnkit Nanavati, MD  oxyCODONE-acetaminophen (PERCOCET) 7.5-325 MG tablet Take 0.5-1 tablets by mouth every 4 (four) hours as needed for moderate pain.  07/09/15   Historical Provider, MD  pregabalin (LYRICA) 75 MG capsule Take 150 mg by mouth 2 (two) times daily as needed (nerve pain).    Historical Provider, MD    Family History Family History  Problem Relation Age of Onset  . Diabetes Mother     Social History Social History  Substance Use Topics  . Smoking status: Current Every Day Smoker    Packs/day: 0.50    Types: Cigarettes  . Smokeless tobacco: Former NeurosurgeonUser  . Alcohol use Yes     Comment: socially     Allergies   Latex   Review  of Systems Review of Systems  Constitutional: Negative.  Negative for fever.  HENT: Negative.   Respiratory: Negative.  Negative for shortness of breath.   Cardiovascular: Positive for chest pain. Negative for palpitations.  All other systems reviewed and are negative.    Physical Exam Triage Vital Signs ED Triage Vitals [07/21/16 1846]  Enc Vitals Group     BP 136/70     Pulse Rate 63     Resp 20     Temp 98.6 F (37 C)     Temp Source Oral     SpO2 100 %     Weight      Height      Head Circumference      Peak Flow      Pain Score 6     Pain Loc      Pain Edu?      Excl. in GC?    No data found.   Updated Vital Signs BP 136/70 (BP Location: Right Arm)   Pulse 63   Temp 98.6 F (37 C) (Oral)   Resp 20   SpO2 100%   Visual  Acuity Right Eye Distance:   Left Eye Distance:   Bilateral Distance:    Right Eye Near:   Left Eye Near:    Bilateral Near:     Physical Exam  Constitutional: He is oriented to person, place, and time. He appears well-developed and well-nourished. He appears distressed.  HENT:  Head: Atraumatic.  Neck: Normal range of motion. Neck supple.  Cardiovascular: Normal rate, regular rhythm, normal heart sounds and intact distal pulses.   Pulmonary/Chest: Effort normal and breath sounds normal. No respiratory distress. He exhibits tenderness.  Neurological: He is alert and oriented to person, place, and time.  Skin: Skin is warm and dry.  Nursing note and vitals reviewed.    UC Treatments / Results  Labs (all labs ordered are listed, but only abnormal results are displayed) Labs Reviewed - No data to display  EKG wnl, no acute changes.  Radiology No results found.  Procedures Procedures (including critical care time)  Medications Ordered in UC Medications - No data to display   Initial Impression / Assessment and Plan / UC Course  I have reviewed the triage vital signs and the nursing notes.  Pertinent labs & imaging results that were available during my care of the patient were reviewed by me and considered in my medical decision making (see chart for details).       Final Clinical Impressions(s) / UC Diagnoses   Final diagnoses:  Chest wall pain    New Prescriptions New Prescriptions   MELOXICAM (MOBIC) 7.5 MG TABLET    Take 1 tablet (7.5 mg total) by mouth 2 (two) times daily after a meal.     Linna Hoff, MD 07/21/16 [phone]

## 2016-07-21 NOTE — ED Notes (Addendum)
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 Adv pt to take acetaminophen OTC for pain and to not fill Rx for Meloxicam.... To seek medical attention if pain worsens.... Pt verb understanding.

## 2016-07-21 NOTE — ED Triage Notes (Signed)
Having chest pain since last night and has gotten worse since today. Hurting on the left side of his chest with no radiation. No risk factors except obesity. Did not take any otc medication. York SpanielSaid it is a sharp pain.

## 2016-08-08 ENCOUNTER — Telehealth (INDEPENDENT_AMBULATORY_CARE_PROVIDER_SITE_OTHER): Payer: Self-pay | Admitting: Specialist

## 2016-08-08 NOTE — Telephone Encounter (Signed)
Patient has an appointment on April 19th at 3:15 for lower back, but is requesting pain Rx until he can be seen. Patient has been trying to work through the pain, but the pain is causing  shooting pains to the buttocks, down the legs, and causing his legs to give way even when walking.  °

## 2016-08-08 NOTE — Telephone Encounter (Signed)
Patient has an appointment on April 19th at 3:15 for lower back, but is requesting pain Rx until he can be seen. Patient has been trying to work through the pain, but the pain is causing  shooting pains to the buttocks, down the legs, and causing his legs to give way even when walking.

## 2016-08-09 NOTE — Telephone Encounter (Signed)
Was discharged at [facility] medical improvement in 02/2016, I will not prescribe pain medications without an evaluation and this may require approval by workman's compensation. jen

## 2016-08-10 NOTE — Telephone Encounter (Signed)
I called and lmom advising of Dr. Barbaraann FasterNitka's message below

## 2016-09-14 ENCOUNTER — Ambulatory Visit (INDEPENDENT_AMBULATORY_CARE_PROVIDER_SITE_OTHER): Payer: Self-pay

## 2016-09-14 ENCOUNTER — Ambulatory Visit (INDEPENDENT_AMBULATORY_CARE_PROVIDER_SITE_OTHER): Payer: Worker's Compensation | Admitting: Specialist

## 2016-09-14 ENCOUNTER — Encounter (INDEPENDENT_AMBULATORY_CARE_PROVIDER_SITE_OTHER): Payer: Self-pay | Admitting: Specialist

## 2016-09-14 VITALS — BP 117/70 | HR 74 | Ht 66.0 in | Wt 308.0 lb

## 2016-09-14 DIAGNOSIS — M48062 Spinal stenosis, lumbar region with neurogenic claudication: Secondary | ICD-10-CM

## 2016-09-14 DIAGNOSIS — M5441 Lumbago with sciatica, right side: Secondary | ICD-10-CM | POA: Diagnosis not present

## 2016-09-14 DIAGNOSIS — M4316 Spondylolisthesis, lumbar region: Secondary | ICD-10-CM

## 2016-09-14 MED ORDER — HYDROCODONE-ACETAMINOPHEN 5-325 MG PO TABS: 1.0000 | ORAL_TABLET | Freq: Four times a day (QID) | ORAL | 0 refills | Status: DC | PRN

## 2016-09-14 NOTE — Patient Instructions (Signed)
Avoid bending, stooping and avoid lifting weights greater than 10 lbs. Avoid prolong standing and walking. Avoid frequent bending and stooping  No lifting greater than 10 lbs. May use ice or moist heat for pain. Weight loss is of benefit. Handicap license is approved. Dr. Newton's secretary/Assistant will call to arrange for epidural steroid injection  

## 2016-09-14 NOTE — Progress Notes (Signed)
Office Visit Note   Patient: Mark Luna           Date of Birth: 01-04-1981           MRN: [id number] Visit Date: 09/14/2016              Requested by: No referring provider defined for this encounter. PCP: No PCP Per Patient   Assessment & Plan: Visit Diagnoses:  1. Acute right-sided low back pain with right-sided sciatica   2. Spondylolisthesis, lumbar region   3. Spinal stenosis of lumbar region with neurogenic claudication   Mark Luna returns with worseing pain into his back and radiation into the right leg posteriorly with any bending or stooping or with walking. Pain is  Lower lumbar radicular pattern. In the past ESIs have helped temporize the pain. Last injections in October 2017, since then he has been rated and has not settled. He is considering having surgery to stop the pain and improve his function. Exam today is consistent with a lumbar radiculopathy from exacerbation of the L5-S1 spondylolisthesis,temporally this is the same condition. He should have further treatment, his work compensation case should remain open during treatment. I discussed the fact that Mark Luna is a large individual with BM over 50 which is morbid obesity. As such the risks of  A complication associated with surgical intervention and fusion are increased. Will try ESI and return visit to assess treatment. Out of work until 09/22/2016.  Plan:  Avoid bending, stooping and avoid lifting weights greater than 10 lbs. Avoid prolong standing and walking. Avoid frequent bending and stooping  No lifting greater than 10 lbs. May use ice or moist heat for pain. Weight loss is of benefit. Handicap license is approved. Dr. [place] Blas secretary/Assistant will call to arrange for epidural steroid injection  Follow-Up Instructions: Return in about 3 weeks (around 10/05/2016).   Orders:  Orders Placed This Encounter  Procedures  . XR Lumbar Spine 2-3 Views  . Ambulatory referral to Physical Medicine Rehab     No orders of the defined types were placed in this encounter.     Procedures: No procedures performed   Clinical Data: No additional findings.   Subjective: Chief Complaint  Patient presents with  . Lower Back - Pain    HPI  Review of Systems  HENT: Positive for rhinorrhea and sinus pain.   Eyes: Negative.   Respiratory: Positive for shortness of breath.   Cardiovascular: Negative.   Gastrointestinal: Negative.   Endocrine: Negative.   Genitourinary: Negative.   Musculoskeletal: Positive for arthralgias, back pain, gait problem and myalgias.  Skin: Negative.   Allergic/Immunologic: Negative.   Neurological: Positive for weakness and numbness.  Hematological: Negative.   Psychiatric/Behavioral: Negative.      Objective: Vital Signs: BP 117/70   Pulse 74   Ht  (1.676 m)   Wt (!) 308 lb (139.7 kg)   BMI 49.71 kg/m   Physical Exam  Constitutional: He is oriented to person, place, and time. He appears well-developed and well-nourished.  HENT:  Head: Normocephalic and atraumatic.  Eyes: EOM are normal. Pupils are equal, round, and reactive to light.  Neck: Normal range of motion. Neck supple.  Pulmonary/Chest: Effort normal and breath sounds normal.  Abdominal: Soft. Bowel sounds are normal.  Neurological: He is alert and oriented to person, place, and time.  Skin: Skin is warm and dry.  Psychiatric: He has a normal mood and affect. His behavior is normal. Judgment and thought content normal.  Back Exam   Tenderness  The patient is experiencing tenderness in the lumbar.  Range of Motion  Extension: abnormal  Flexion: abnormal  Lateral Bend Right: abnormal  Lateral Bend Left: abnormal  Rotation Right: abnormal  Rotation Left: abnormal   Muscle Strength  Right Quadriceps:  5/5  Left Quadriceps:  5/5  Right Hamstrings:  5/5  Left Hamstrings:  5/5   Tests  Straight leg raise right: positive Straight leg raise left: negative  Reflexes   Patellar: normal Achilles: normal Biceps: normal Babinski's sign: normal   Other  Toe Walk: abnormal Heel Walk: abnormal Sensation: decreased Gait: abnormal  Erythema: no back redness Scars: absent      Specialty Comments:  No specialty comments available.  Imaging: No results found.   PMFS History: Patient Active Problem List   Diagnosis Date Noted  . Obesity, morbid, BMI 50 or higher (HCC) 07/04/2012  . Internal hemorrhoids with pain/swelling/prolapse 07/04/2012  . H/O diverticulitis of colon 07/04/2012  . Varicocele, left scrotum 07/04/2012   Past Medical History:  Diagnosis Date  . Asthma   . Back pain   . Diverticulitis of colon Jan 2014   Treated with PO ABx  . Obesity, morbid, BMI 50 or higher (HCC) 07/04/2012    Family History  Problem Relation Age of Onset  . Diabetes Mother     Past Surgical History:  Procedure Laterality Date  . CHOLE[place]OMY  ~2006   [name]   . GASTRIC BYPASS  2014  . HEMORRHOID SURGERY    . WRIST SURGERY     Social History   Occupational History  . Not on file.   Social History Main Topics  . Smoking status: Current Every Day Smoker    Packs/day: 0.50    Types: Cigarettes  . Smokeless tobacco: Former Neurosurgeon  . Alcohol use Yes     Comment: socially  . Drug use: No  . Sexual activity: Not on file

## 2016-09-17 ENCOUNTER — Encounter (HOSPITAL_COMMUNITY): Payer: Self-pay | Admitting: Emergency Medicine

## 2016-09-17 ENCOUNTER — Ambulatory Visit (HOSPITAL_COMMUNITY)
Admission: EM | Admit: 2016-09-17 | Discharge: 2016-09-17 | Disposition: A | Discharge status: Home / Self Care | Payer: BLUE CROSS/BLUE SHIELD | Attending: Family Medicine | Admitting: Family Medicine

## 2016-09-17 DIAGNOSIS — F17210 Nicotine dependence, cigarettes, uncomplicated: Secondary | ICD-10-CM | POA: Insufficient documentation

## 2016-09-17 DIAGNOSIS — Z91040 Latex allergy status: Secondary | ICD-10-CM | POA: Diagnosis not present

## 2016-09-17 DIAGNOSIS — N390 Urinary tract infection, site not specified: Secondary | ICD-10-CM | POA: Diagnosis not present

## 2016-09-17 DIAGNOSIS — R300 Dysuria: Secondary | ICD-10-CM

## 2016-09-17 DIAGNOSIS — N4889 Other specified disorders of penis: Secondary | ICD-10-CM | POA: Diagnosis not present

## 2016-09-17 DIAGNOSIS — N200 Calculus of kidney: Secondary | ICD-10-CM | POA: Diagnosis not present

## 2016-09-17 DIAGNOSIS — R319 Hematuria, unspecified: Secondary | ICD-10-CM

## 2016-09-17 LAB — POCT URINALYSIS DIP (DEVICE)
Bilirubin Urine: NEGATIVE
Glucose, UA: NEGATIVE mg/dL
KETONES UR: NEGATIVE mg/dL
Nitrite: POSITIVE — AB
PH: 6 (ref 5.0–8.0)
PROTEIN: 100 mg/dL — AB
Urobilinogen, UA: 0.2 mg/dL (ref 0.0–1.0)

## 2016-09-17 MED ORDER — CIPROFLOXACIN HCL 500 MG PO TABS: 500.0000 mg | ORAL_TABLET | Freq: Two times a day (BID) | ORAL | 0 refills | Status: DC

## 2016-09-17 MED ORDER — AZITHROMYCIN 250 MG PO TABS
1000.0000 mg | ORAL_TABLET | Freq: Once | ORAL | Status: AC
  Administered 2016-09-17: 1000 mg via ORAL

## 2016-09-17 MED ORDER — STERILE WATER FOR INJECTION IJ SOLN
INTRAMUSCULAR | Status: AC
  Filled 2016-09-17: qty 10

## 2016-09-17 MED ORDER — CEFTRIAXONE SODIUM 250 MG IJ SOLR
250.0000 mg | Freq: Once | INTRAMUSCULAR | Status: AC
  Administered 2016-09-17: 250 mg via INTRAMUSCULAR

## 2016-09-17 MED ORDER — HYDROCODONE-ACETAMINOPHEN 5-325 MG PO TABS: 1.0000 | ORAL_TABLET | ORAL | 0 refills | Status: DC | PRN

## 2016-09-17 MED ORDER — CEFTRIAXONE SODIUM 250 MG IJ SOLR
INTRAMUSCULAR | Status: AC
  Filled 2016-09-17: qty 250

## 2016-09-17 MED ORDER — AZITHROMYCIN 250 MG PO TABS
ORAL_TABLET | ORAL | Status: AC
  Filled 2016-09-17: qty 4

## 2016-09-17 MED ORDER — PHENAZOPYRIDINE HCL 200 MG PO TABS: 200.0000 mg | ORAL_TABLET | Freq: Three times a day (TID) | ORAL | 0 refills | Status: DC

## 2016-09-17 NOTE — ED Provider Notes (Signed)
CSN: [id number]     Arrival date & time 09/17/16  1738 History   First MD Initiated Contact with Patient 09/17/16 1758     Chief Complaint  Patient presents with  . Urinary Tract Infection   (Consider location/radiation/quality/duration/timing/severity/associated sxs/prior Treatment) Patient is having back pain radiating into abdomen and he has been urinating blood for 2 days.  He has been feeling some better but his urine is cloudy and with blood clots.   The history is provided by the patient.  Urinary Tract Infection  This is a new problem. The problem occurs constantly. Pertinent negatives include no chest pain. Nothing aggravates the symptoms. Nothing relieves the symptoms. He has tried nothing for the symptoms.    Past Medical History:  Diagnosis Date  . Asthma   . Back pain   . Diverticulitis of colon Jan 2014   Treated with PO ABx  . Obesity, morbid, BMI 50 or higher (HCC) 07/04/2012   Past Surgical History:  Procedure Laterality Date  . CHOLECY[place]ECTOMY  ~2006   [name]   . GASTRIC BYPASS  2014  . HEMORRHOID SURGERY    . WRIST SURGERY     Family History  Problem Relation Age of Onset  . Diabetes Mother    Social History  Substance Use Topics  . Smoking status: Current Every Day Smoker    Packs/day: 0.50    Types: Cigarettes  . Smokeless tobacco: Former Neurosurgeon  . Alcohol use Yes     Comment: socially    Review of Systems  Constitutional: Negative.   HENT: Negative.   Eyes: Negative.   Respiratory: Negative.   Cardiovascular: Negative.  Negative for chest pain.  Gastrointestinal: Negative.   Endocrine: Negative.   Genitourinary: Positive for dysuria, flank pain, hematuria and penile pain.  Allergic/Immunologic: Negative.   Neurological: Negative.   Hematological: Negative.   Psychiatric/Behavioral: Negative.     Allergies  Latex  Home Medications   Prior to Admission medications   Medication Sig Start Date End Date Taking? Authorizing Provider   calcium citrate-vitamin D (CITRACAL+D) 315-200 MG-UNIT per tablet Take 1 tablet by mouth daily.    Historical Provider, MD  ciprofloxacin (CIPRO) 500 MG tablet Take 1 tablet (500 mg total) by mouth 2 (two) times daily. 09/17/16   Deatra Canter, FNP  Cyanocobalamin (VITAMIN B 12 PO) Take 1 tablet by mouth daily.    Historical Provider, MD  HYDROcodone-acetaminophen (NORCO/VICODIN) 5-325 MG tablet Take 1 tablet by mouth every 6 (six) hours as needed for moderate pain. 09/14/16   Kerrin Champagne, MD  HYDROcodone-acetaminophen (NORCO/VICODIN) 5-325 MG tablet Take 1-2 tablets by mouth every 4 (four) hours as needed. 09/17/16   Deatra Canter, FNP  Iron TABS Take 1 tablet by mouth 2 (two) times a week.    Historical Provider, MD  Melatonin 5 MG TABS Take 5 mg by mouth every other day.    Historical Provider, MD  meloxicam (MOBIC) 7.5 MG tablet Take 1 tablet (7.5 mg total) by mouth 2 (two) times daily after a meal. 07/21/16   Linna Hoff, MD  Multiple Vitamin (MULTIVITAMIN WITH MINERALS) TABS tablet Take 1-2 tablets by mouth 2 (two) times daily. Take 2 tablets in the morning and 1 tablet in the evening    Historical Provider, MD  naproxen (NAPROSYN) 500 MG tablet Take 1 tablet (500 mg total) by mouth 2 (two) times daily. 09/06/15   Derwood Kaplan, MD  ondansetron (ZOFRAN ODT) 4 MG disintegrating tablet Take 1 tablet (  4 mg total) by mouth every 8 (eight) hours as needed for nausea or vomiting. 09/06/15   Derwood Kaplan, MD  oxyCODONE-acetaminophen (PERCOCET) 7.5-325 MG tablet Take 0.5-1 tablets by mouth every 4 (four) hours as needed for moderate pain.  07/09/15   Historical Provider, MD  phenazopyridine (PYRIDIUM) 200 MG tablet Take 1 tablet (200 mg total) by mouth 3 (three) times daily. 09/17/16   Deatra Canter, FNP  pregabalin (LYRICA) 75 MG capsule Take 150 mg by mouth 2 (two) times daily as needed (nerve pain).    Historical Provider, MD   Meds Ordered and Administered this Visit   Medications   cefTRIAXone (ROCEPHIN) injection 250 mg (250 mg Intramuscular Given 09/17/16 1818)  azithromycin (ZITHROMAX) tablet 1,000 mg (1,000 mg Oral Given 09/17/16 1817)    BP 127/65 (BP Location: Left Arm) Comment (BP Location): large cuff  Pulse 76   Temp 99.2 F (37.3 C) (Oral)   Resp 18   SpO2 100%  No data found.   Physical Exam  Constitutional: He is oriented to person, place, and time. He appears well-nourished.  HENT:  Head: Atraumatic.  Eyes: EOM are normal.  Cardiovascular: Normal rate, regular rhythm and normal heart sounds.   Pulmonary/Chest: Effort normal and breath sounds normal.  Abdominal: Soft.  Musculoskeletal: Normal range of motion.  Neurological: He is alert and oriented to person, place, and time.  Nursing note and vitals reviewed.   Urgent Care Course     Procedures (including critical care time)  Labs Review Labs Reviewed  POCT URINALYSIS DIP (DEVICE) - Abnormal; Notable for the following:       Result Value   Hgb urine dipstick LARGE (*)    Protein, ur 100 (*)    Nitrite POSITIVE (*)    Leukocytes, UA MODERATE (*)    All other components within normal limits  URINE CULTURE  URINE CYTOLOGY ANCILLARY ONLY    Imaging Review No results found.   Visual Acuity Review  Right Eye Distance:   Left Eye Distance:   Bilateral Distance:    Right Eye Near:   Left Eye Near:    Bilateral Near:         MDM   1. Dysuria   2. Urinary tract infection with hematuria, site unspecified   3. Kidney stone    UA  UA culture UA cytology - GC/Chlamydia Trich   Rocephin  IM Zithromycin  x 4 #4  Cipro  one bid x 10 days #20 Pyridium  one po tid x2days #6 Norco 5/325 one to two po q 6 hours prn pain #12  Push po fluids, rest, and if not passing in 2 days or if pain worsens or unable to void go to Ed.      Deatra Canter, FNP 09/17/16 1914

## 2016-09-17 NOTE — ED Triage Notes (Signed)
Feeling pressure in penis, pain with urination and blood from penis

## 2016-09-18 LAB — URINE CYTOLOGY ANCILLARY ONLY
Chlamydia: NEGATIVE
Neisseria Gonorrhea: NEGATIVE
Trichomonas: NEGATIVE

## 2016-09-20 LAB — URINE CULTURE: Culture: >=100000 — AB

## 2016-10-02 ENCOUNTER — Ambulatory Visit (INDEPENDENT_AMBULATORY_CARE_PROVIDER_SITE_OTHER): Payer: Self-pay | Admitting: Specialist

## 2016-10-07 ENCOUNTER — Encounter (HOSPITAL_COMMUNITY): Payer: Self-pay | Admitting: Emergency Medicine

## 2016-10-07 ENCOUNTER — Emergency Department (HOSPITAL_COMMUNITY)
Admission: EM | Admit: 2016-10-07 | Discharge: 2016-10-07 | Disposition: A | Discharge status: Other Acute Inpatient Hospital | Payer: BLUE CROSS/BLUE SHIELD | Attending: Emergency Medicine | Admitting: Emergency Medicine

## 2016-10-07 DIAGNOSIS — N4830 Priapism, unspecified: Secondary | ICD-10-CM | POA: Insufficient documentation

## 2016-10-07 DIAGNOSIS — F17210 Nicotine dependence, cigarettes, uncomplicated: Secondary | ICD-10-CM | POA: Diagnosis not present

## 2016-10-07 DIAGNOSIS — J45909 Unspecified asthma, uncomplicated: Secondary | ICD-10-CM | POA: Insufficient documentation

## 2016-10-07 MED ORDER — HYDROMORPHONE HCL 1 MG/ML IJ SOLN
2.0000 mg | Freq: Once | INTRAMUSCULAR | Status: AC
  Administered 2016-10-07: 2 mg via INTRAMUSCULAR
  Filled 2016-10-07: qty 2

## 2016-10-07 MED ORDER — PHENYLEPHRINE 200 MCG/ML FOR PRIAPISM / HYPOTENSION
100.0000 ug | INTRAMUSCULAR | Status: DC | PRN
  Administered 2016-10-07: 100 ug via INTRACAVERNOUS
  Filled 2016-10-07 (×2): qty 50

## 2016-10-07 NOTE — ED Triage Notes (Signed)
Pt. Stated, (crying) I have priapism, and this started around 0500 this morning and I waited thought it might go away.

## 2016-10-07 NOTE — ED Notes (Signed)
Pt ambulatory to restroom

## 2016-10-07 NOTE — ED Notes (Addendum)
[facility] Pt advised this RN that his priapism had "gone down" and he did not want to be seen. Pt was advised that he needed to be aware of any new symptoms and to come back to ED if necessary. Pt verbalized understanding. This RN [facility] and advised that pt issue had resolved and he did not want to be seen. Dr. Judd Lienelo stated that that was OK. Pt ambulates w/o difficulty into and out of ED and is A&O. Discharging pt from system per policy after pt verbalized that he did not want to be evaluated or seen again.

## 2016-10-07 NOTE — ED Notes (Signed)
Report called to Software engineerusan RN at [facility]. Pt ambulatory out of ED and [facility] Pt verbalizes understanding and states he will not drive after receiving dilaudid IM.

## 2016-10-07 NOTE — ED Provider Notes (Signed)
[facility]-EMERGENCY DEPT Provider Note   CSN: [id number] Arrival date & time: 10/07/16  1022     History   Chief Complaint No chief complaint on file.   HPI Mark Luna is a 36 y.o. male.  Patient is a 36 year old male with past medical history of asthma and obesity with history of gastric bypass. He presents today for evaluation of priapism. He reports waking this morning at 5 AM with am erection which has not gone down and is now causing him severe pain. He denies any injury or trauma. He denies any alleviating factors. He had a similar episode 10 years ago which did not require injection and resolve spontaneously.   The history is provided by the patient.    Past Medical History:  Diagnosis Date  . Asthma   . Back pain   . Diverticulitis of colon Jan 2014   Treated with PO ABx  . Obesity, morbid, BMI 50 or higher (HCC) 07/04/2012    Patient Active Problem List   Diagnosis Date Noted  . Obesity, morbid, BMI 50 or higher (HCC) 07/04/2012  . Internal hemorrhoids with pain/swelling/prolapse 07/04/2012  . H/O diverticulitis of colon 07/04/2012  . Varicocele, left scrotum 07/04/2012    Past Surgical History:  Procedure Laterality Date  . CHOLECYSTECTOMY  ~2006   [name]   . GASTRIC BYPASS  2014  . HEMORRHOID SURGERY    . WRIST SURGERY         Home Medications    Prior to Admission medications   Medication Sig Start Date End Date Taking? Authorizing Provider  calcium citrate-vitamin D (CITRACAL+D) 315-200 MG-UNIT per tablet Take 1 tablet by mouth daily.    [provider]  ciprofloxacin (CIPRO) 500 MG tablet Take 1 tablet (500 mg total) by mouth 2 (two) times daily. 09/17/16   Deatra Canterxford, William J, FNP  Cyanocobalamin (VITAMIN B 12 PO) Take 1 tablet by mouth daily.    [provider]  HYDROcodone-acetaminophen (NORCO/VICODIN) 5-325 MG tablet Take 1 tablet by mouth every 6 (six) hours as needed for moderate pain. 09/14/16   Kerrin ChampagneNitka, James E, MD    HYDROcodone-acetaminophen (NORCO/VICODIN) 5-325 MG tablet Take 1-2 tablets by mouth every 4 (four) hours as needed. 09/17/16   Deatra Canterxford, William J, FNP  Iron TABS Take 1 tablet by mouth 2 (two) times a week.    [provider]  Melatonin 5 MG TABS Take 5 mg by mouth every other day.    [provider]  meloxicam (MOBIC) 7.5 MG tablet Take 1 tablet (7.5 mg total) by mouth 2 (two) times daily after a meal. 07/21/16   Kindl, Quita SkyeJames D, MD  Multiple Vitamin (MULTIVITAMIN WITH MINERALS) TABS tablet Take 1-2 tablets by mouth 2 (two) times daily. Take 2 tablets in the morning and 1 tablet in the evening    [provider]  naproxen (NAPROSYN) 500 MG tablet Take 1 tablet (500 mg total) by mouth 2 (two) times daily. 09/06/15   Derwood KaplanNanavati, Ankit, MD  ondansetron (ZOFRAN ODT) 4 MG disintegrating tablet Take 1 tablet (4 mg total) by mouth every 8 (eight) hours as needed for nausea or vomiting. 09/06/15   Derwood KaplanNanavati, Ankit, MD  oxyCODONE-acetaminophen (PERCOCET) 7.5-325 MG tablet Take 0.5-1 tablets by mouth every 4 (four) hours as needed for moderate pain.  07/09/15   [provider]  phenazopyridine (PYRIDIUM) 200 MG tablet Take 1 tablet (200 mg total) by mouth 3 (three) times daily. 09/17/16   Deatra Canterxford, William J, FNP  pregabalin (LYRICA)  75 MG capsule Take 150 mg by mouth 2 (two) times daily as needed (nerve pain).    [provider]    Family History Family History  Problem Relation Age of Onset  . Diabetes Mother     Social History Social History  Substance Use Topics  . Smoking status: Current Every Day Smoker    Packs/day: 0.50    Types: Cigarettes  . Smokeless tobacco: Former Neurosurgeon  . Alcohol use Yes     Comment: socially     Allergies   Latex   Review of Systems Review of Systems  All other systems reviewed and are negative.    Physical Exam Updated Vital Signs BP 140/82 (BP Location: Left Arm)   Temp 98.2 F (36.8 C) (Oral)   Resp (!) 26   Ht  5\' 6"  (1.676 m)   Wt (!) 308 lb (139.7 kg)   SpO2 99%   BMI 49.71 kg/m   Physical Exam  Constitutional: He is oriented to person, place, and time. He appears well-developed and well-nourished. No distress.  Patient appears uncomfortable.  HENT:  Head: Normocephalic and atraumatic.  Mouth/Throat: Oropharynx is clear and moist.  Neck: Normal range of motion. Neck supple.  Cardiovascular: Normal rate and regular rhythm.  Exam reveals no friction rub.   No murmur heard. Pulmonary/Chest: Effort normal and breath sounds normal. No respiratory distress. He has no wheezes. He has no rales.  Abdominal: Soft. Bowel sounds are normal. He exhibits no distension. There is no tenderness.  Genitourinary:  Genitourinary Comments: Penis is erect, painful to palpation.  Musculoskeletal: Normal range of motion. He exhibits no edema.  Neurological: He is alert and oriented to person, place, and time. Coordination normal.  Skin: Skin is warm and dry. He is not diaphoretic.  Nursing note and vitals reviewed.    ED Treatments / Results  Labs (all labs ordered are listed, but only abnormal results are displayed) Labs Reviewed - No data to display  EKG  EKG Interpretation None       Radiology No results found.  Procedures Procedures (including critical care time)  Medications Ordered in ED Medications  HYDROmorphone (DILAUDID) injection 2 mg (not administered)     Initial Impression / Assessment and Plan / ED Course  I have reviewed the triage vital signs and the nursing notes.  Pertinent labs & imaging results that were available during my care of the patient were reviewed by me and considered in my medical decision making (see chart for details).  Patient presents with priapism of uncertain cause. He has no history of sickle cell disease and denies any history of cocaine use, Viagra use. He reports this has happened in the past, however his always resolve spontaneously.  PRIAPISM  TREATMENT (injection) Patient was prepped and draped in standard sterile fashion Just prior to procedure a timeout was performed Left corpus cavernosum was entered with a 25-gauge syringe Blood was aspirated 2 cc of 200 mcg/ml phenylephrine was injected The patient tolerated the procedure No complications  He was injected with phenylephrine and observed with no relief. I then discussed the case with Dr. Judie Grieve from urology who is recommending transfer to [facility]. The patient was then sent there for urology evaluation.  Several hours later, received a phone call from the nurse at [facility] who informed me the patient showed up there, however did not sign in to be seen as his symptoms resolved on his way there.   Final Clinical Impressions(s) / ED  Diagnoses   Final diagnoses:  None    New Prescriptions New Prescriptions   No medications on file     Geoffery Lyons, MD 10/08/16 1046

## 2016-10-07 NOTE — ED Notes (Signed)
[facility] nurse contacted to see if pt has arrived at this time. According to RN he had not arrived yet

## 2016-10-07 NOTE — ED Notes (Signed)
Dr. Judd Lienelo at pt bedside administering phenylephrine for priapism at this time

## 2016-10-09 ENCOUNTER — Encounter (INDEPENDENT_AMBULATORY_CARE_PROVIDER_SITE_OTHER): Payer: Self-pay | Admitting: Physical Medicine and Rehabilitation

## 2016-10-09 ENCOUNTER — Ambulatory Visit (INDEPENDENT_AMBULATORY_CARE_PROVIDER_SITE_OTHER): Payer: Worker's Compensation

## 2016-10-09 ENCOUNTER — Ambulatory Visit (INDEPENDENT_AMBULATORY_CARE_PROVIDER_SITE_OTHER): Payer: Worker's Compensation | Admitting: Physical Medicine and Rehabilitation

## 2016-10-09 VITALS — BP 134/74 | HR 72

## 2016-10-09 DIAGNOSIS — Q762 Congenital spondylolisthesis: Secondary | ICD-10-CM | POA: Diagnosis not present

## 2016-10-09 DIAGNOSIS — M4316 Spondylolisthesis, lumbar region: Secondary | ICD-10-CM | POA: Diagnosis not present

## 2016-10-09 DIAGNOSIS — M5416 Radiculopathy, lumbar region: Secondary | ICD-10-CM | POA: Diagnosis not present

## 2016-10-09 MED ORDER — LIDOCAINE HCL (PF) 1 % IJ SOLN
2.0000 mL | Freq: Once | INTRAMUSCULAR | Status: AC
  Administered 2016-10-09: 2 mL

## 2016-10-09 MED ORDER — METHYLPREDNISOLONE ACETATE 80 MG/ML IJ SUSP
80.0000 mg | Freq: Once | INTRAMUSCULAR | Status: AC
  Administered 2016-10-09: 80 mg

## 2016-10-09 NOTE — Patient Instructions (Signed)

## 2016-10-09 NOTE — Progress Notes (Deleted)
   Mark Luna - 36 y.o. male MRN [id number]  Date of birth: 1980-06-26  Office Visit Note: Visit Date: 10/09/2016 PCP: Patient, No Pcp Per Referred by: Kerrin ChampagneNitka, James E, MD  Subjective: Chief Complaint  Patient presents with  . Lower Back - Pain   HPI: HPI  ROS Otherwise per HPI.  Assessment & Plan: Visit Diagnoses: No diagnosis found.  Plan: No additional findings.   Meds & Orders: No orders of the defined types were placed in this encounter.  No orders of the defined types were placed in this encounter.   Follow-up: No Follow-up on file.   Procedures: No procedures performed  No notes on file   Clinical History: No specialty comments available.  He reports that he has been smoking Cigarettes.  He has been smoking about 0.50 packs per day. He has quit using smokeless tobacco. No results for input(s): HGBA1C, LABURIC in the last 8760 hours.  Objective:  VS:  HT:    WT:   BMI:     BP:   HR: bpm  TEMP: ( )  RESP:  Physical Exam  Ortho Exam Imaging: No results found.  Past Medical/Family/Surgical/Social History: Medications & Allergies reviewed per EMR Patient Active Problem List   Diagnosis Date Noted  . Obesity, morbid, BMI 50 or higher (HCC) 07/04/2012  . Internal hemorrhoids with pain/swelling/prolapse 07/04/2012  . H/O diverticulitis of colon 07/04/2012  . Varicocele, left scrotum 07/04/2012   Past Medical History:  Diagnosis Date  . Asthma   . Back pain   . Diverticulitis of colon Jan 2014   Treated with PO ABx  . Obesity, morbid, BMI 50 or higher (HCC) 07/04/2012   Family History  Problem Relation Age of Onset  . Diabetes Mother    Past Surgical History:  Procedure Laterality Date  . CHOLECYSTECTOMY  ~2006   [name]   . GASTRIC BYPASS  2014  . HEMORRHOID SURGERY    . WRIST SURGERY     Social History   Occupational History  . Not on file.   Social History Main Topics  . Smoking status: Current Every Day Smoker    Packs/day: 0.50     Types: Cigarettes  . Smokeless tobacco: Former NeurosurgeonUser  . Alcohol use Yes     Comment: socially  . Drug use: No  . Sexual activity: Not on file

## 2016-10-09 NOTE — Progress Notes (Deleted)
Pain across lower back and worse on right side. Occasionally sharp pains down right leg to ankle normally with bending. Numbness and tingling in both feet.

## 2016-10-10 NOTE — Procedures (Signed)
Lumbosacral Transforaminal Epidural Steroid Injection - Infraneural Approach with Fluoroscopic Guidance  Patient: Mark Luna      Date of Birth: 23-Sep-1980 MRN: [id number] PCP: Patient, No Pcp Per      Visit Date: 10/09/2016   Mr. Mark Luna is a 36 year old gentleman that comes in today at the request of Dr. Otelia SergeantNitka for right L5 and S1 transforaminal epidural steroid injection. 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 He has chronic low back pain. We've seen him actually in the past for injection and it was beneficial. MRI findings show listhesis and spondylolysis. He'll follow up with Dr. Otelia SergeantNitka.  Universal Protocol:    Date/Time: 05/15/186:01 AM  Consent Given By: the patient  Position: PRONE   Additional Comments: Vital signs were monitored before and after the procedure. Patient was prepped and draped in the usual sterile fashion. The correct patient, procedure, and site was verified.   Injection Procedure Details:  Procedure Site One Meds Administered:  Meds ordered this encounter  Medications  . lidocaine (PF) (XYLOCAINE) 1 % injection 2 mL  . methylPREDNISolone acetate (DEPO-MEDROL) injection 80 mg      Laterality: Right  Location/Site:  L5-S1 S1-2  Needle size: 22 G  Needle type: Spinal  Needle Placement: Transforaminal  Findings:  -Contrast Used: 1 mL iohexol 180 mg iodine/mL   -Comments: Excellent flow of contrast along the nerve and into the epidural space.  Procedure Details: After squaring off the end-plates of the desired vertebral level to get a true AP view, the C-arm was obliqued to the painful side so that the superior articulating process is positioned about 1/3 the length of the inferior endplate.  The needle was aimed toward the junction of the superior  articular process and the transverse process of the inferior vertebrae. The needle's initial entry is in the lower third of the foramen through Kambin's triangle. The soft tissues overlying this target were infiltrated with 2-3 ml. of 1% Lidocaine without Epinephrine.  The spinal needle was then inserted and advanced toward the target using a "trajectory" view along the fluoroscope beam.  Under AP and lateral visualization, the needle was advanced so it did not puncture dura and did not traverse medially beyond the 6 o'clock position of the pedicle. Bi-planar projections were used to confirm position. Aspiration was confirmed to be negative for CSF and/or blood. A 1-2 ml. volume of Isovue-250 was injected and flow of contrast was noted at each level. Radiographs were obtained for documentation purposes.   After attaining the desired flow of contrast documented above, a 0.5 to 1.0 ml test dose of 0.25% Marcaine was injected into each respective transforaminal space.  The patient was observed for 90 seconds post injection.  After no sensory deficits were reported, and normal lower extremity motor function was noted,   the above injectate was administered so that equal amounts of the injectate were placed at each foramen (level) into the transforaminal epidural space.   Additional Comments:  No complications occurred Dressing: Band-Aid    Post-procedure details: Patient was observed during the procedure. Post-procedure instructions were reviewed.  Patient left the clinic in stable condition.

## 2016-11-06 ENCOUNTER — Encounter (INDEPENDENT_AMBULATORY_CARE_PROVIDER_SITE_OTHER): Payer: Self-pay | Admitting: Specialist

## 2016-11-06 ENCOUNTER — Ambulatory Visit (INDEPENDENT_AMBULATORY_CARE_PROVIDER_SITE_OTHER): Payer: Worker's Compensation | Admitting: Specialist

## 2016-11-06 VITALS — BP 106/60 | HR 73 | Ht 66.0 in | Wt 305.0 lb

## 2016-11-06 DIAGNOSIS — M4306 Spondylolysis, lumbar region: Secondary | ICD-10-CM | POA: Diagnosis not present

## 2016-11-06 DIAGNOSIS — M4316 Spondylolisthesis, lumbar region: Secondary | ICD-10-CM | POA: Diagnosis not present

## 2016-11-06 DIAGNOSIS — M4726 Other spondylosis with radiculopathy, lumbar region: Secondary | ICD-10-CM | POA: Diagnosis not present

## 2016-11-06 MED ORDER — HYDROCODONE-ACETAMINOPHEN 5-325 MG PO TABS: 1.0000 | ORAL_TABLET | Freq: Four times a day (QID) | ORAL | 0 refills | Status: DC | PRN

## 2016-11-06 NOTE — Patient Instructions (Signed)
Avoid bending, stooping and avoid lifting weights greater than 10 lbs. Avoid prolong standing and walking. Avoid frequent bending and stooping  No lifting greater than 10 lbs. May use ice or moist heat for pain. Weight loss is of benefit. Handicap license is approved.  

## 2016-11-06 NOTE — Progress Notes (Signed)
Office Visit Note   Patient: Mark Luna           Date of Birth: Oct 05, 1980           MRN: [id number] Visit Date: 11/06/2016              Requested by: No referring provider defined for this encounter. PCP: Patient, No Pcp Per   Assessment & Plan: Visit Diagnoses:  1. Spondylolysis, lumbar region   2. Spondylolisthesis, lumbar region   3. Other spondylosis with radiculopathy, lumbar region     Plan: Avoid bending, stooping and avoid lifting weights greater than 10 lbs. Avoid prolong standing and walking. Avoid frequent bending and stooping  No lifting greater than 10 lbs. May use ice or moist heat for pain. Weight loss is of benefit. Handicap license is approved.  Follow-Up Instructions: No Follow-up on file.   Orders:  No orders of the defined types were placed in this encounter.  No orders of the defined types were placed in this encounter.     Procedures: No procedures performed   Clinical Data: No additional findings.   Subjective: Chief Complaint  Patient presents with  . Lower Back - Follow-up    He had right L5-S1, S1-2 transforaminal injection with Dr. Alvester MorinNewton on 10/09/16.    36 year old male with bilateral L5 spondylolysis and low grade spondylolisthesis, he is having persistent low back and right leg greater than left leg pain posteriorly with numbness and tingling since an injury on the job 01/2014 when he fell slipping on metal incline crate coming out a freezer at [facility].  Left leg is in the calf and in the right it is in the right buttock and down the right leg into the right leg. Pain worsened with lifting today. Was out of work for a week with sciatica previously. Sharp pains with moving or twisting and extending his back. [facility]. He is still having periods of dysfunction associated with his lumbosacral junction.  Pain improved with resting over night. Still over weight. Has had a  gastric  Sleeve.     Review of Systems  Constitutional: Negative.   HENT: Negative.   Eyes: Negative.   Respiratory: Negative.   Cardiovascular: Negative.   Gastrointestinal: Negative.   Endocrine: Negative.   Genitourinary: Negative.   Musculoskeletal: Negative.   Skin: Negative.   Allergic/Immunologic: Negative.   Neurological: Negative.   Hematological: Negative.   Psychiatric/Behavioral: Negative.      Objective: Vital Signs: BP 106/60 (BP Location: Left Arm, Patient Position: Sitting)   Pulse 73   Ht 5\' 6"  (1.676 m)   Wt (!) 305 lb (138.3 kg)   BMI 49.23 kg/m   Physical Exam  Constitutional: He is oriented to person, place, and time. He appears well-developed and well-nourished.  HENT:  Head: Normocephalic and atraumatic.  Eyes: EOM are normal. Pupils are equal, round, and reactive to light.  Neck: Normal range of motion. Neck supple.  Pulmonary/Chest: Effort normal and breath sounds normal.  Abdominal: Soft. Bowel sounds are normal.  Neurological: He is alert and oriented to person, place, and time.  Skin: Skin is warm and dry.  Psychiatric: He has a normal mood and affect. His behavior is normal. Judgment and thought content normal.    Back Exam   Tenderness  The patient is experiencing tenderness in the lumbar.  Range of Motion  Extension: abnormal  Flexion: normal  Lateral Bend Right: normal  Lateral Bend Left: normal  Rotation Right: normal  Rotation Left: normal   Muscle Strength  Right Quadriceps:  4/5  Left Quadriceps:  4/5  Right Hamstrings:  4/5  Left Hamstrings:  4/5   Tests  Straight leg raise right: negative Straight leg raise left: negative  Reflexes  Patellar: normal Achilles: normal Biceps: normal Babinski's sign: normal   Other  Toe Walk: normal Heel Walk: normal Sensation: normal Gait: normal       Specialty Comments:  No specialty comments available.  Imaging: No results found.   PMFS History: Patient  Active Problem List   Diagnosis Date Noted  . Spondylolysis, lumbar region 11/06/2016  . Spondylolisthesis, lumbar region 11/06/2016  . Obesity, morbid, BMI 50 or higher (HCC) 07/04/2012  . Internal hemorrhoids with pain/swelling/prolapse 07/04/2012  . H/O diverticulitis of colon 07/04/2012  . Varicocele, left scrotum 07/04/2012   Past Medical History:  Diagnosis Date  . Asthma   . Back pain   . Diverticulitis of colon Jan 2014   Treated with PO ABx  . Obesity, morbid, BMI 50 or higher (HCC) 07/04/2012    Family History  Problem Relation Age of Onset  . Diabetes Mother     Past Surgical History:  Procedure Laterality Date  . [place]  [place]2006   [name]   . GASTRIC BYPASS  2014  . HEMORRHOID SURGERY    . WRIST SURGERY     Social History   Occupational History  . Not on file.   Social History Main Topics  . Smoking status: Current Every Day Smoker    Packs/day: 0.50    Types: Cigarettes  . Smokeless tobacco: Former Neurosurgeon  . Alcohol use Yes     Comment: socially  . Drug use: No  . Sexual activity: Not on file

## 2016-11-06 NOTE — Addendum Note (Signed)
Addended by: Vira BrownsNITKA, JAMES on: 11/06/2016 05:01 PM   Modules accepted: Orders

## 2016-11-10 ENCOUNTER — Telehealth (INDEPENDENT_AMBULATORY_CARE_PROVIDER_SITE_OTHER): Payer: Self-pay

## 2016-11-10 NOTE — Telephone Encounter (Signed)
Emailed the 11/06/16 office note to adj per her request

## 2016-12-25 ENCOUNTER — Encounter (INDEPENDENT_AMBULATORY_CARE_PROVIDER_SITE_OTHER): Payer: Self-pay | Admitting: Specialist

## 2016-12-25 ENCOUNTER — Ambulatory Visit (INDEPENDENT_AMBULATORY_CARE_PROVIDER_SITE_OTHER): Payer: Worker's Compensation | Admitting: Specialist

## 2016-12-25 VITALS — BP 109/62 | HR 74 | Ht 66.0 in | Wt 307.0 lb

## 2016-12-25 DIAGNOSIS — M48062 Spinal stenosis, lumbar region with neurogenic claudication: Secondary | ICD-10-CM

## 2016-12-25 DIAGNOSIS — M4316 Spondylolisthesis, lumbar region: Secondary | ICD-10-CM

## 2016-12-25 DIAGNOSIS — M5136 Other intervertebral disc degeneration, lumbar region: Secondary | ICD-10-CM | POA: Diagnosis not present

## 2016-12-25 MED ORDER — HYDROCODONE-ACETAMINOPHEN 5-325 MG PO TABS: 1.0000 | ORAL_TABLET | Freq: Four times a day (QID) | ORAL | 0 refills | Status: DC | PRN

## 2016-12-25 NOTE — Progress Notes (Addendum)
Office Visit Note   Patient: Mark Luna           Date of Birth: 1980/10/11           MRN: [id number] Visit Date: 12/25/2016              Requested by: No referring provider defined for this encounter. PCP: Patient, No Pcp Per   Assessment & Plan: Visit Diagnoses:  1. Spondylolisthesis, lumbar region   2. Spinal stenosis of lumbar region with neurogenic claudication   3. Degenerative disc disease, lumbar     Plan: Avoid bending, stooping and avoid lifting weights greater than 10 lbs. Avoid prolong standing and walking. Order for a new walker with wheels. Surgery scheduling secretary Tivis RingerSherri Billings, will call you in the next week to schedule for surgery.  Surgery recommended is a two level lumbar fusion L5-S1this would be done with rods, screws and cages with local bone graft and allograft (donor bone graft). Take hydrocodone for for pain. Risk of surgery includes risk of infection 1 in 200 patients, bleeding 1/2% chance you would need a transfusion.   Risk to the nerves is one in 10,000. You will need to use a brace for 3 months and wean from the brace on the 4th month. Expect improved walking and standing tolerance. Expect relief of leg pain but numbness may persist depending on the length and degree of pressure that has been present.   Follow-Up Instructions: Return in about 4 weeks (around 01/22/2017).   Orders:  No orders of the defined types were placed in this encounter.  Meds ordered this encounter  Medications  . HYDROcodone-acetaminophen (NORCO/VICODIN) 5-325 MG tablet    Sig: Take 1 tablet by mouth every 6 (six) hours as needed for moderate pain.    Dispense:  50 tablet    Refill:  0      Procedures: No procedures performed   Clinical Data: Findings:  MRI from [facility] 02/2016 with disc protrusion L5-S1 with bilateral right greater than left L5 foramenal stenosis. Plain radiographs with Grade 1 spondylolisthesis L5-S1, CT Scan with multiple levels of  spondylosis, spondylolysis at the L5 level with fragmentation of the right L5-S1 facet greater than left. Foramenal narrowing right L5 greater than left L5.    Subjective: Chief Complaint  Patient presents with  . Lower Back - Pain    36 year old male with back pain at the lumbosacral junction and raditation into the right calf muscle and buttock and left back of the leg. The right side is greater than left. Bending with a pulling sensation. Pain worse with bending, stooping and sitting increases the discomfort. No bowel or blader difficulty. He is having trouble with erections for months now. He plans to see a urologist.The pain limits his ability to stand and to walk. Walking greater than a block is painful with bilateral leg sciatica.       Review of Systems  Constitutional: Negative.   HENT: Negative.   Eyes: Negative.   Respiratory: Negative.   Cardiovascular: Negative.   Gastrointestinal: Negative.   Endocrine: Negative.   Genitourinary: Negative.   Musculoskeletal: Negative.   Skin: Negative.   Allergic/Immunologic: Negative.   Neurological: Negative.   Hematological: Negative.   Psychiatric/Behavioral: Negative.      Objective: Vital Signs: BP 109/62   Pulse 74   Ht 5\' 6"  (1.676 m)   Wt (!) 307 lb (139.3 kg)   BMI 49.55 kg/m   Physical Exam  Constitutional: He  is oriented to person, place, and time. He appears well-developed and well-nourished.  HENT:  Head: Normocephalic and atraumatic.  Eyes: Pupils are equal, round, and reactive to light. EOM are normal.  Neck: Normal range of motion. Neck supple.  Pulmonary/Chest: Effort normal and breath sounds normal.  Abdominal: Soft. Bowel sounds are normal.  Neurological: He is alert and oriented to person, place, and time.  Skin: Skin is warm and dry.  Psychiatric: He has a normal mood and affect. His behavior is normal. Judgment and thought content normal.    Back Exam   Tenderness  The patient is  experiencing tenderness in the lumbar.  Range of Motion  Extension: abnormal  Flexion: abnormal  Lateral Bend Right: abnormal  Lateral Bend Left: abnormal  Rotation Right: abnormal  Rotation Left: abnormal   Muscle Strength  Right Quadriceps:  5/5  Left Quadriceps:  5/5  Right Hamstrings:  5/5  Left Hamstrings:  5/5   Tests  Straight leg raise left: negative  Reflexes  Patellar: abnormal Achilles: abnormal Babinski's sign: normal   Other  Toe Walk: abnormal Heel Walk: abnormal  Comments:  Right EHL 4/5 compared with left 5-/5      Specialty Comments:  No specialty comments available.  Imaging: No results found.   PMFS History: Patient Active Problem List   Diagnosis Date Noted  . Spondylolysis, lumbar region 11/06/2016  . Spondylolisthesis, lumbar region 11/06/2016  . Obesity, morbid, BMI 50 or higher (HCC) 07/04/2012  . Internal hemorrhoids with pain/swelling/prolapse 07/04/2012  . H/O diverticulitis of colon 07/04/2012  . Varicocele, left scrotum 07/04/2012   Past Medical History:  Diagnosis Date  . Asthma   . Back pain   . Diverticulitis of colon Jan 2014   Treated with PO ABx  . Obesity, morbid, BMI 50 or higher (HCC) 07/04/2012    Family History  Problem Relation Age of Onset  . Diabetes Mother     Past Surgical History:  Procedure Laterality Date  . CHOLE[place]OMY  ~2006   [name]   . GASTRIC BYPASS  2014  . HEMORRHOID SURGERY    . WRIST SURGERY     Social History   Occupational History  . Not on file.   Social History Main Topics  . Smoking status: Current Every Day Smoker    Packs/day: 0.50    Types: Cigarettes  . Smokeless tobacco: Former NeurosurgeonUser  . Alcohol use Yes     Comment: socially  . Drug use: No  . Sexual activity: Not on file

## 2016-12-25 NOTE — Patient Instructions (Signed)
Avoid bending, stooping and avoid lifting weights greater than 10 lbs. Avoid prolong standing and walking. Order for a new walker with wheels. Surgery scheduling secretary Tivis RingerSherri Billings, will call you in the next week to schedule for surgery.  Surgery recommended is a two level lumbar fusion L5-S1this would be done with rods, screws and cages with local bone graft and allograft (donor bone graft). Take hydrocodone for for pain. Risk of surgery includes risk of infection 1 in 200 patients, bleeding 1/2% chance you would need a transfusion.   Risk to the nerves is one in 10,000. You will need to use a brace for 3 months and wean from the brace on the 4th month. Expect improved walking and standing tolerance. Expect relief of leg pain but numbness may persist depending on the length and degree of pressure that has been present.

## 2017-03-12 ENCOUNTER — Telehealth (INDEPENDENT_AMBULATORY_CARE_PROVIDER_SITE_OTHER): Payer: Self-pay | Admitting: Specialist

## 2017-03-12 NOTE — Telephone Encounter (Signed)
Patient called requesting a new handicap plaquered.  CB#[phone]

## 2017-03-26 ENCOUNTER — Emergency Department (HOSPITAL_COMMUNITY)
Admission: EM | Admit: 2017-03-26 | Discharge: 2017-03-26 | Disposition: A | Discharge status: Home / Self Care | Payer: BLUE CROSS/BLUE SHIELD | Attending: Emergency Medicine | Admitting: Emergency Medicine

## 2017-03-26 ENCOUNTER — Encounter (HOSPITAL_COMMUNITY): Payer: Self-pay | Admitting: *Deleted

## 2017-03-26 DIAGNOSIS — Z79899 Other long term (current) drug therapy: Secondary | ICD-10-CM | POA: Diagnosis not present

## 2017-03-26 DIAGNOSIS — K0889 Other specified disorders of teeth and supporting structures: Secondary | ICD-10-CM

## 2017-03-26 DIAGNOSIS — F17210 Nicotine dependence, cigarettes, uncomplicated: Secondary | ICD-10-CM | POA: Insufficient documentation

## 2017-03-26 MED ORDER — IBUPROFEN 600 MG PO TABS: 600.0000 mg | ORAL_TABLET | Freq: Four times a day (QID) | ORAL | 0 refills | Status: DC | PRN

## 2017-03-26 MED ORDER — PENICILLIN V POTASSIUM 500 MG PO TABS
500.0000 mg | ORAL_TABLET | Freq: Once | ORAL | Status: AC
  Administered 2017-03-26: 500 mg via ORAL
  Filled 2017-03-26: qty 1

## 2017-03-26 MED ORDER — PENICILLIN V POTASSIUM 500 MG PO TABS: 500.0000 mg | ORAL_TABLET | Freq: Four times a day (QID) | ORAL | 0 refills | Status: AC

## 2017-03-26 MED ORDER — HYDROCODONE-ACETAMINOPHEN 5-325 MG PO TABS: 1.0000 | ORAL_TABLET | Freq: Three times a day (TID) | ORAL | 0 refills | Status: DC | PRN

## 2017-03-26 MED ORDER — IBUPROFEN 800 MG PO TABS
800.0000 mg | ORAL_TABLET | Freq: Once | ORAL | Status: AC
  Administered 2017-03-26: 800 mg via ORAL
  Filled 2017-03-26: qty 1

## 2017-03-26 MED ORDER — OXYCODONE-ACETAMINOPHEN 5-325 MG PO TABS
2.0000 | ORAL_TABLET | Freq: Once | ORAL | Status: DC
  Filled 2017-03-26: qty 2

## 2017-03-26 NOTE — ED Triage Notes (Signed)
Pt c/o right lower mouth pain stating "my tooth is gone, the pain started yesterday."

## 2017-03-26 NOTE — Discharge Instructions (Signed)
Take penicillin as prescribed until finished.  Take ibuprofen as prescribed for pain.  For severe pain, you may take Norco along with ibuprofen as prescribed. Follow up with a dentist

## 2017-03-26 NOTE — ED Notes (Signed)
Pt refused pain meds d/t no driver available to drive him home.

## 2017-03-26 NOTE — ED Provider Notes (Signed)
[facility]-EMERGENCY DEPT Provider Note   CSN: [id number] Arrival date & time: 03/26/17  0257     History   Chief Complaint Chief Complaint  Patient presents with  . Dental Pain    HPI Mark Luna is a 36 y.o. male.  The history is provided by the patient. No language interpreter was used.  Dental Pain   This is a new problem. The current episode started yesterday. The problem occurs constantly. The problem has been gradually worsening. The pain is moderate. He has tried acetaminophen (And Oragel) for the symptoms. The treatment provided no relief.    Past Medical History:  Diagnosis Date  . Asthma   . Back pain   . Diverticulitis of colon Jan 2014   Treated with PO ABx  . Obesity, morbid, BMI 50 or higher (HCC) 07/04/2012    Patient Active Problem List   Diagnosis Date Noted  . Spondylolysis, lumbar region 11/06/2016  . Spondylolisthesis, lumbar region 11/06/2016  . Obesity, morbid, BMI 50 or higher (HCC) 07/04/2012  . Internal hemorrhoids with pain/swelling/prolapse 07/04/2012  . H/O diverticulitis of colon 07/04/2012  . Varicocele, left scrotum 07/04/2012    Past Surgical History:  Procedure Laterality Date  . CHOLECY[place]ECTOMY  ~2006   [name]   . GASTRIC BYPASS  2014  . HEMORRHOID SURGERY    . WRIST SURGERY         Home Medications    Prior to Admission medications   Medication Sig Start Date End Date Taking? Authorizing Provider  calcium citrate-vitamin D (CITRACAL+D) 315-200 MG-UNIT per tablet Take 1 tablet by mouth 2 (two) times daily.     [provider]  ciprofloxacin (CIPRO) 500 MG tablet Take 1 tablet (500 mg total) by mouth 2 (two) times daily. 09/17/16   Deatra Canterxford, William J, FNP  Cyanocobalamin (VITAMIN B 12 PO) Take 1 tablet by mouth daily.    [provider]  HYDROcodone-acetaminophen (NORCO/VICODIN) 5-325 MG tablet Take 1-2 tablets by mouth every 8 (eight) hours as needed for severe pain.  03/26/17   Antony MaduraHumes, Zaineb Nowaczyk, PA-C  ibuprofen (ADVIL,MOTRIN) 600 MG tablet Take 1 tablet (600 mg total) by mouth every 6 (six) hours as needed. 03/26/17   Antony MaduraHumes, Yolinda Duerr, PA-C  Iron TABS Take 1 tablet by mouth daily as needed (for supplement).     [provider]  Melatonin 5 MG TABS Take 5 mg by mouth at bedtime as needed (for supplement).     [provider]  meloxicam (MOBIC) 7.5 MG tablet Take 1 tablet (7.5 mg total) by mouth 2 (two) times daily after a meal. 07/21/16   Kindl, Quita SkyeJames D, MD  Multiple Vitamin (MULTIVITAMIN WITH MINERALS) TABS tablet Take 1 tablet by mouth 3 (three) times daily.     [provider]  naproxen (NAPROSYN) 500 MG tablet Take 1 tablet (500 mg total) by mouth 2 (two) times daily. 09/06/15   Derwood KaplanNanavati, Ankit, MD  ondansetron (ZOFRAN ODT) 4 MG disintegrating tablet Take 1 tablet (4 mg total) by mouth every 8 (eight) hours as needed for nausea or vomiting. 09/06/15   Derwood KaplanNanavati, Ankit, MD  penicillin v potassium (VEETID) 500 MG tablet Take 1 tablet (500 mg total) by mouth 4 (four) times daily. 03/26/17 04/02/17  Antony MaduraHumes, Akyla Vavrek, PA-C  phenazopyridine (PYRIDIUM) 200 MG tablet Take 1 tablet (200 mg total) by mouth 3 (three) times daily. 09/17/16   Deatra Canterxford, William J, FNP  tamsulosin (FLOMAX) 0.4 MG CAPS capsule Take 0.4 mg by mouth daily.  [provider]    Family History Family History  Problem Relation Age of Onset  . Diabetes Mother     Social History Social History  Substance Use Topics  . Smoking status: Current Every Day Smoker    Packs/day: 0.50    Types: Cigarettes  . Smokeless tobacco: Former Neurosurgeon  . Alcohol use Yes     Comment: socially     Allergies   Latex   Review of Systems Review of Systems Ten systems reviewed and are negative for acute change, except as noted in the HPI.    Physical Exam Updated Vital Signs BP 125/80 (BP Location: Left Arm)   Pulse 67   Temp 99.1 F (37.3 C) (Oral)   Resp 16   Ht 5\' 6"  (1.676  m)   Wt (!) 140.6 kg (310 lb)   SpO2 99%   BMI 50.04 kg/m   Physical Exam  Constitutional: He is oriented to person, place, and time. He appears well-developed and well-nourished. No distress.  Patient appears uncomfortable  HENT:  Head: Normocephalic and atraumatic.  Mouth/Throat: Uvula is midline. No trismus in the jaw. Abnormal dentition. No dental abscesses or uvula swelling.    No significant trismus.  Soft oral floor.  No gingival swelling or fluctuance.  Eyes: Conjunctivae and EOM are normal. No scleral icterus.  Neck: Normal range of motion.  No meningismus  Pulmonary/Chest: Effort normal. No respiratory distress.  Respirations even and unlabored  Musculoskeletal: Normal range of motion.  Neurological: He is alert and oriented to person, place, and time. He exhibits normal muscle tone. Coordination normal.  Skin: Skin is warm and dry. No rash noted. He is not diaphoretic. No erythema. No pallor.  Psychiatric: He has a normal mood and affect. His behavior is normal.  Nursing note and vitals reviewed.    ED Treatments / Results  Labs (all labs ordered are listed, but only abnormal results are displayed) Labs Reviewed - No data to display  EKG  EKG Interpretation None       Radiology No results found.  Procedures Procedures (including critical care time)  Medications Ordered in ED Medications  penicillin v potassium (VEETID) tablet 500 mg (not administered)  ibuprofen (ADVIL,MOTRIN) tablet 800 mg (not administered)  oxyCODONE-acetaminophen (PERCOCET/ROXICET) 5-325 MG per tablet 2 tablet (not administered)     Initial Impression / Assessment and Plan / ED Course  I have reviewed the triage vital signs and the nursing notes.  Pertinent labs & imaging results that were available during my care of the patient were reviewed by me and considered in my medical decision making (see chart for details).     Patient with toothache.  No gross abscess.  Exam  unconcerning for Ludwig's angina or spread of infection.  Will treat with penicillin and pain medicine.  Urged patient to follow-up with dentist.  Return precautions discussed and provided.  Patient discharged in stable condition with no unaddressed concerns.   Final Clinical Impressions(s) / ED Diagnoses   Final diagnoses:  Pain, dental    New Prescriptions New Prescriptions   HYDROCODONE-ACETAMINOPHEN (NORCO/VICODIN) 5-325 MG TABLET    Take 1-2 tablets by mouth every 8 (eight) hours as needed for severe pain.   IBUPROFEN (ADVIL,MOTRIN) 600 MG TABLET    Take 1 tablet (600 mg total) by mouth every 6 (six) hours as needed.   PENICILLIN V POTASSIUM (VEETID) 500 MG TABLET    Take 1 tablet (500 mg total) by mouth 4 (four) times daily.  Antony Madura, PA-C 03/26/17 0359    Ward, Layla Maw, DO 03/26/17 [phone]

## 2017-08-28 ENCOUNTER — Telehealth (INDEPENDENT_AMBULATORY_CARE_PROVIDER_SITE_OTHER): Payer: Self-pay | Admitting: Specialist

## 2017-08-28 NOTE — Telephone Encounter (Signed)
Yes, I don't expect he will see much change in his standing or walking capacity due to spondylolisthesis at L5-S1 with spondylolysis, decrease his transit in and out of stores may allow him to be more independent. jen

## 2017-08-28 NOTE — Telephone Encounter (Signed)
Patient wants to know if he can get his handicap sticker renewed. CB # [phone]

## 2017-08-28 NOTE — Telephone Encounter (Signed)
Patient wants to know if he can get his handicap sticker renewed----Last seen 11/2016, Please advise

## 2017-08-29 NOTE — Telephone Encounter (Signed)
Form completed, pending signature from Dr. Otelia SergeantNitka

## 2017-08-30 NOTE — Telephone Encounter (Signed)
I called and advised patient that form was approved for 6 month temp placard. And I put it at the front desk for pick up

## 2017-09-21 ENCOUNTER — Emergency Department (HOSPITAL_COMMUNITY)
Admission: EM | Admit: 2017-09-21 | Discharge: 2017-09-21 | Disposition: A | Discharge status: Home / Self Care | Payer: BLUE CROSS/BLUE SHIELD | Attending: Emergency Medicine | Admitting: Emergency Medicine

## 2017-09-21 ENCOUNTER — Encounter (HOSPITAL_COMMUNITY): Payer: Self-pay | Admitting: Emergency Medicine

## 2017-09-21 DIAGNOSIS — F17210 Nicotine dependence, cigarettes, uncomplicated: Secondary | ICD-10-CM | POA: Insufficient documentation

## 2017-09-21 DIAGNOSIS — T2112XA Burn of first degree of abdominal wall, initial encounter: Secondary | ICD-10-CM | POA: Diagnosis not present

## 2017-09-21 DIAGNOSIS — Y939 Activity, unspecified: Secondary | ICD-10-CM | POA: Insufficient documentation

## 2017-09-21 DIAGNOSIS — Z79899 Other long term (current) drug therapy: Secondary | ICD-10-CM | POA: Diagnosis not present

## 2017-09-21 DIAGNOSIS — T310 Burns involving less than 10% of body surface: Secondary | ICD-10-CM | POA: Diagnosis not present

## 2017-09-21 DIAGNOSIS — T22111A Burn of first degree of right forearm, initial encounter: Secondary | ICD-10-CM | POA: Insufficient documentation

## 2017-09-21 DIAGNOSIS — Z23 Encounter for immunization: Secondary | ICD-10-CM | POA: Insufficient documentation

## 2017-09-21 DIAGNOSIS — X102XXA Contact with fats and cooking oils, initial encounter: Secondary | ICD-10-CM | POA: Diagnosis not present

## 2017-09-21 DIAGNOSIS — Y929 Unspecified place or not applicable: Secondary | ICD-10-CM | POA: Diagnosis not present

## 2017-09-21 DIAGNOSIS — T300 Burn of unspecified body region, unspecified degree: Secondary | ICD-10-CM

## 2017-09-21 DIAGNOSIS — Y990 Civilian activity done for income or pay: Secondary | ICD-10-CM | POA: Diagnosis not present

## 2017-09-21 MED ORDER — NAPROXEN 375 MG PO TABS: 375.0000 mg | ORAL_TABLET | Freq: Two times a day (BID) | ORAL | 0 refills | Status: DC

## 2017-09-21 MED ORDER — SILVER SULFADIAZINE 1 % EX CREA
TOPICAL_CREAM | Freq: Once | CUTANEOUS | Status: AC
  Administered 2017-09-21: 17:00:00 via TOPICAL
  Filled 2017-09-21: qty 85

## 2017-09-21 MED ORDER — IBUPROFEN 400 MG PO TABS
600.0000 mg | ORAL_TABLET | Freq: Once | ORAL | Status: AC
  Administered 2017-09-21: 600 mg via ORAL
  Filled 2017-09-21: qty 1

## 2017-09-21 MED ORDER — TETANUS-DIPHTH-ACELL PERTUSSIS 5-2.5-18.5 LF-MCG/0.5 IM SUSP
0.5000 mL | Freq: Once | INTRAMUSCULAR | Status: AC
  Administered 2017-09-21: 0.5 mL via INTRAMUSCULAR
  Filled 2017-09-21: qty 0.5

## 2017-09-21 NOTE — ED Notes (Signed)
Pt ambulatory to room with steady gait

## 2017-09-21 NOTE — Discharge Instructions (Addendum)
Please follow burn care handout.  You were given Silver Sulfadiazine in the department - Please apply to a thickness of 1/16 inch twice daily; reapply as needed; burned area should be covered with cream at all times Please follow up with PCP this week.   Contact a doctor if: Your condition does not get better. Your condition gets worse. You have a fever. Your burn looks different or starts to have black or red spots on it. Your burn feels warm to the touch. Your pain is not controlled with medicine. Get help right away if: You lose feeling in your hand or arm If you cannot move the joint in your arm You have redness, swelling, or pain at the site of the burn. You have fluid, blood, or pus coming from your burn. You have red streaks near the burn. You have very bad pain.

## 2017-09-21 NOTE — ED Notes (Signed)
Pt verbalized understanding of all d/c instructions, prescriptions, and f/u information. Opportunity for questioning and answers provided. VSS. All belongings with patient at this time. Pt ambulatory to lobby with steady gait.   

## 2017-09-21 NOTE — ED Provider Notes (Signed)
[facility] EMERGENCY DEPARTMENT Provider Note   CSN: [id number] Arrival date & time: 09/21/17  1203     History   Chief Complaint Chief Complaint  Patient presents with  . Burn    HPI Mark Luna is a 37 y.o. male who presents the emergency department today for burn.  Patient states that he was at work this morning when he had a pain that/grease onto him.  He notes that he has a small area near his antecubital fossa of the right arm as well as the lower portion of his abdomen and reports that the pants his left inner thigh became in contact with as well.  He reports he has mild pain to touch with the area underneath his abdomen.  He denies any blistering or break in the skin.  States his tetanus is not up-to-date.  He has not taken anything for symptoms.  No difficulty moving joints, nausea/vomiting no other symptoms at this time.  HPI  Past Medical History:  Diagnosis Date  . Asthma   . Back pain   . Diverticulitis of colon Jan 2014   Treated with PO ABx  . Obesity, morbid, BMI 50 or higher (HCC) 07/04/2012    Patient Active Problem List   Diagnosis Date Noted  . Spondylolysis, lumbar region 11/06/2016  . Spondylolisthesis, lumbar region 11/06/2016  . Obesity, morbid, BMI 50 or higher (HCC) 07/04/2012  . Internal hemorrhoids with pain/swelling/prolapse 07/04/2012  . H/O diverticulitis of colon 07/04/2012  . Varicocele, left scrotum 07/04/2012    Past Surgical History:  Procedure Laterality Date  . [place]  [place]2006   [name]   . GASTRIC BYPASS  2014  . HEMORRHOID SURGERY    . WRIST SURGERY          Home Medications    Prior to Admission medications   Medication Sig Start Date End Date Taking? Authorizing Provider  calcium citrate-vitamin D (CITRACAL+D) 315-200 MG-UNIT per tablet Take 1 tablet by mouth 2 (two) times daily.     [provider]  ciprofloxacin (CIPRO) 500 MG tablet Take 1 tablet (500 mg total) by mouth 2  (two) times daily. 09/17/16   Deatra Canter, FNP  Cyanocobalamin (VITAMIN B 12 PO) Take 1 tablet by mouth daily.    [provider]  HYDROcodone-acetaminophen (NORCO/VICODIN) 5-325 MG tablet Take 1-2 tablets by mouth every 8 (eight) hours as needed for severe pain. 03/26/17   Antony Madura, PA-C  ibuprofen (ADVIL,MOTRIN) 600 MG tablet Take 1 tablet (600 mg total) by mouth every 6 (six) hours as needed. 03/26/17   Antony Madura, PA-C  Iron TABS Take 1 tablet by mouth daily as needed (for supplement).     [provider]  Melatonin 5 MG TABS Take 5 mg by mouth at bedtime as needed (for supplement).     [provider]  meloxicam (MOBIC) 7.5 MG tablet Take 1 tablet (7.5 mg total) by mouth 2 (two) times daily after a meal. 07/21/16   Kindl, Quita Skye, MD  Multiple Vitamin (MULTIVITAMIN WITH MINERALS) TABS tablet Take 1 tablet by mouth 3 (three) times daily.     [provider]  naproxen (NAPROSYN) 500 MG tablet Take 1 tablet (500 mg total) by mouth 2 (two) times daily. 09/06/15   Derwood Kaplan, MD  ondansetron (ZOFRAN ODT) 4 MG disintegrating tablet Take 1 tablet (4 mg total) by mouth every 8 (eight) hours as needed for nausea or vomiting. 09/06/15   Derwood Kaplan, MD  phenazopyridine (PYRIDIUM) 200 MG tablet Take 1 tablet (200 mg total) by mouth 3 (three) times daily. 09/17/16   Deatra Canter, FNP  tamsulosin (FLOMAX) 0.4 MG CAPS capsule Take 0.4 mg by mouth daily.    [provider]    Family History Family History  Problem Relation Age of Onset  . Diabetes Mother     Social History Social History   Tobacco Use  . Smoking status: Current Every Day Smoker    Packs/day: 0.50    Types: Cigarettes  . Smokeless tobacco: Former Engineer, water Use Topics  . Alcohol use: Yes    Comment: socially  . Drug use: No     Allergies   Latex   Review of Systems Review of Systems  All other systems reviewed and are negative.    Physical  Exam Updated Vital Signs BP 129/69 (BP Location: Left Arm)   Pulse 74   Temp 98.2 F (36.8 C) (Oral)   Resp 18   Ht 5\' 6"  (1.676 m)   Wt (!) 142.9 kg (315 lb)   SpO2 99%   BMI 50.84 kg/m   Physical Exam  Constitutional: He appears well-developed and well-nourished.  HENT:  Head: Normocephalic and atraumatic.  Right Ear: External ear normal.  Left Ear: External ear normal.  Eyes: Conjunctivae are normal. Right eye exhibits no discharge. Left eye exhibits no discharge. No scleral icterus.  Pulmonary/Chest: Effort normal. No respiratory distress.  Abdominal:    Musculoskeletal:       Right elbow: He exhibits normal range of motion and no swelling.       Right wrist: Normal. He exhibits normal range of motion and no tenderness.       Left hip: He exhibits normal range of motion and normal strength.       Left knee: No tenderness found. No medial joint line tenderness noted.  0.5cm x 1 cm area of blanchable erythema to the right AC. No break of the skin or blistering.  No eschar.  No difficulty with range of motion of the right upper extremity or left lower extremity. Neurovascularly intact distally. Compartments soft above and below affected joint.   Neurological: He is alert.  Skin: Skin is warm and dry. Capillary refill takes less than 2 seconds. No pallor.  No noted erythema, break in skin or blistering of the left lower leg.  Psychiatric: He has a normal mood and affect.  Nursing note and vitals reviewed.    ED Treatments / Results  Labs (all labs ordered are listed, but only abnormal results are displayed) Labs Reviewed - No data to display  EKG None  Radiology No results found.  Procedures Procedures (including critical care time)  Medications Ordered in ED Medications  Tdap (BOOSTRIX) injection 0.5 mL (has no administration in time range)  ibuprofen (ADVIL,MOTRIN) tablet 600 mg (has no administration in time range)  silver sulfADIAZINE (SILVADENE) 1 % cream  (has no administration in time range)     Initial Impression / Assessment and Plan / ED Course  I have reviewed the triage vital signs and the nursing notes.  Pertinent labs & imaging results that were available during my care of the patient were reviewed by me and considered in my medical decision making (see chart for details).     37 y.o. with 2 areas of superficial, first-degree burn that occurred earlier today.  One area to the right AC.  No difficulty with range of motion.  Compartments are soft.  He is neurovascularly intact.  One also occurred to the lower abdomen.  Patient's vital signs are reassuring.  No systemic symptoms at this time.  Patient's tetanus updated.  Patient given silver sulfadiazine in the department. Area dressed.  Patient instructed to apply 1/16 inch thickness of cream twice daily and keep the area covered at all times. I advised the patient to follow-up with PCP this week. Specific return precautions discussed. Time was given for all questions to be answered. The patient verbalized understanding and agreement with plan. The patient appears safe for discharge home.  Final Clinical Impressions(s) / ED Diagnoses   Final diagnoses:  First degree burn    ED Discharge Orders        Ordered    naproxen (NAPROSYN) 375 MG tablet  2 times daily with meals     09/21/17 1643       Princella PellegriniMaczis, Mylinda Brook M, PA-C 09/21/17 1644    Vanetta MuldersZackowski, Scott, MD 09/23/17 [phone]

## 2017-09-21 NOTE — ED Triage Notes (Signed)
Pt states 2 hours ago he was at work at [employer] when he dropped grease to his right arm, abdomen, and left inner thigh. No obvious burn noted to right arm. Pt has some redness to abdomen but no blistering. No area noted to thigh.

## 2017-10-27 IMAGING — CT CT ABD-PELV W/ CM
2 of 5 series · 17 of 46 positions shown, 19 images · IV contrast (iopamidol)
Comparison: 06/13/2012

CLINICAL DATA: Fell on wet floor 09/04/2015. Left-sided abdominal
tenderness and left-sided low back pain. Initial encounter.

EXAM:
CT ABDOMEN AND PELVIS WITH CONTRAST
TECHNIQUE: Multidetector CT imaging of the abdomen and pelvis was performed
using the standard protocol following bolus administration of
intravenous contrast.
CONTRAST:  100mL PZG92E-EFF IOPAMIDOL (PZG92E-EFF) INJECTION 61%

[Series 2: abd/pel with · axial · 0.85mm/px · z∈[+1187,+1587]mm · 14 of 91 slices shown, 16 images]
[im 6/91  soft-tissue]
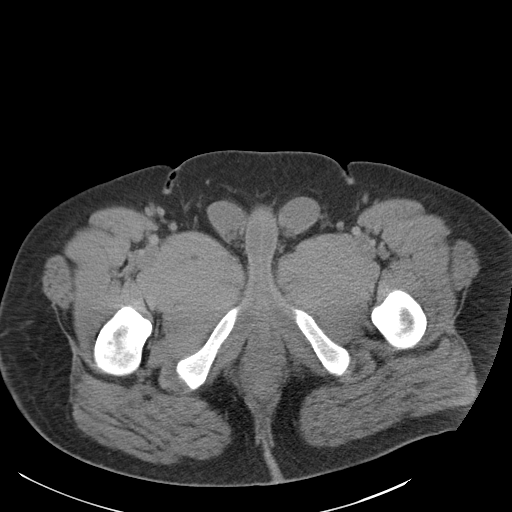
[im 6/91  bone]
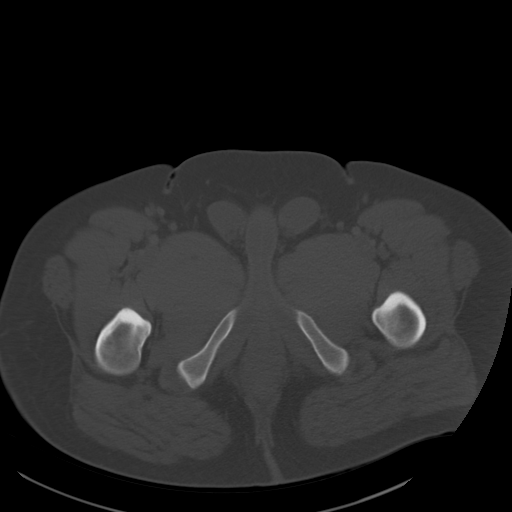
[im 11/91  soft-tissue]
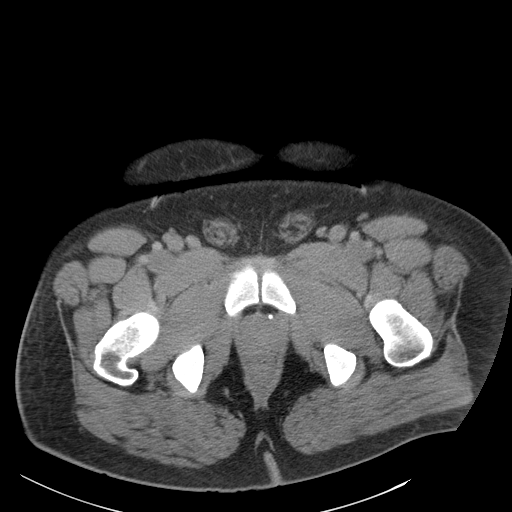
[im 21/91  soft-tissue]
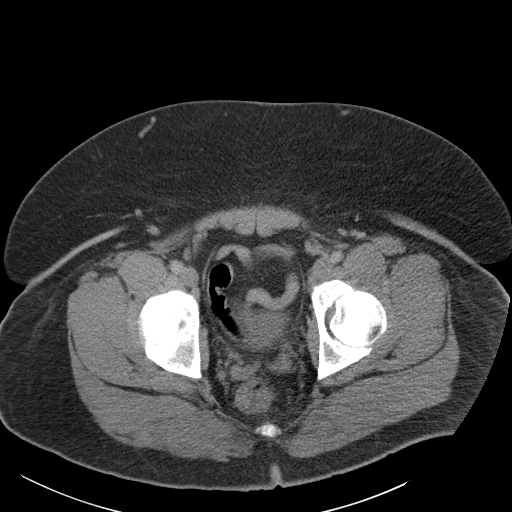
[im 26/91  soft-tissue]
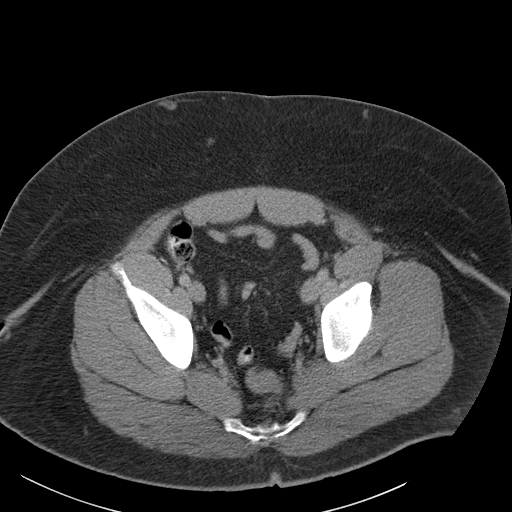
[im 31/91  soft-tissue]
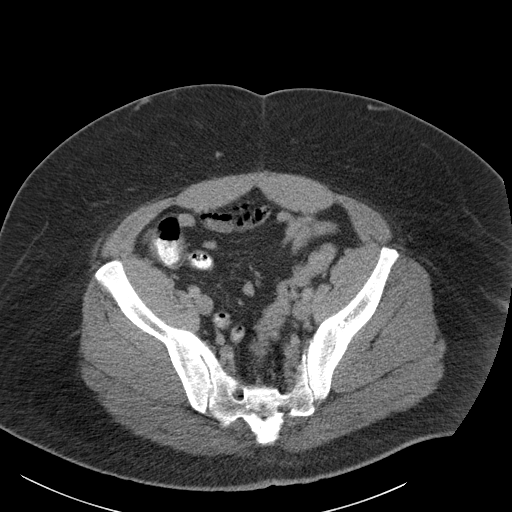
[im 36/91  soft-tissue]
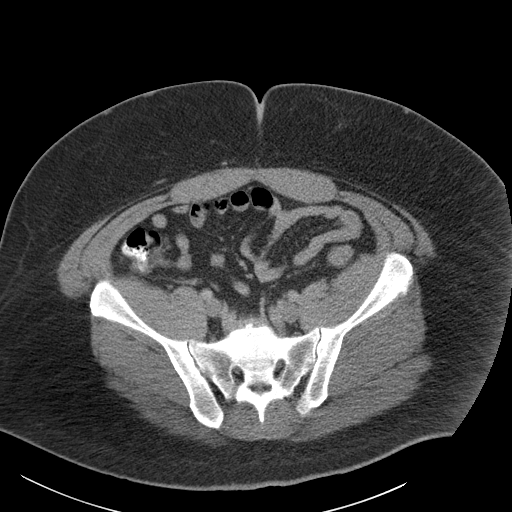
[im 41/91  soft-tissue]
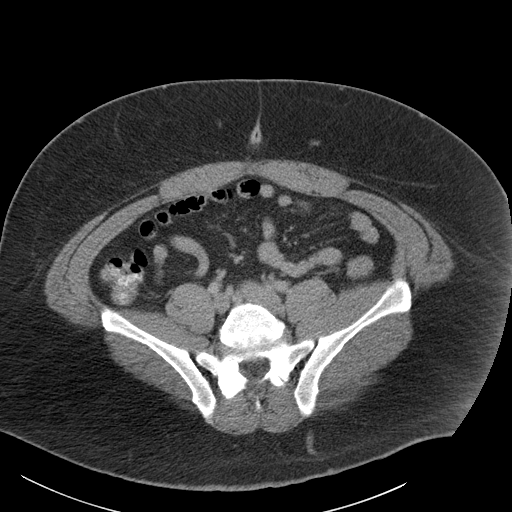
[im 51/91  soft-tissue]
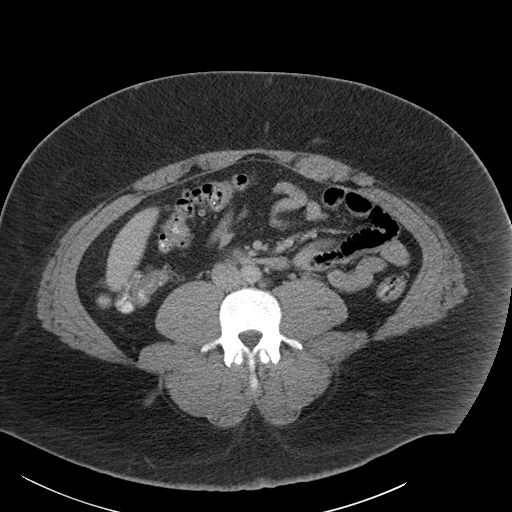
[im 56/91  soft-tissue]
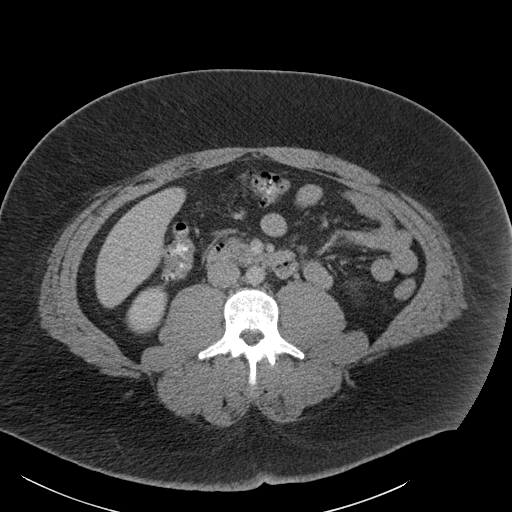
[im 56/91  bone]
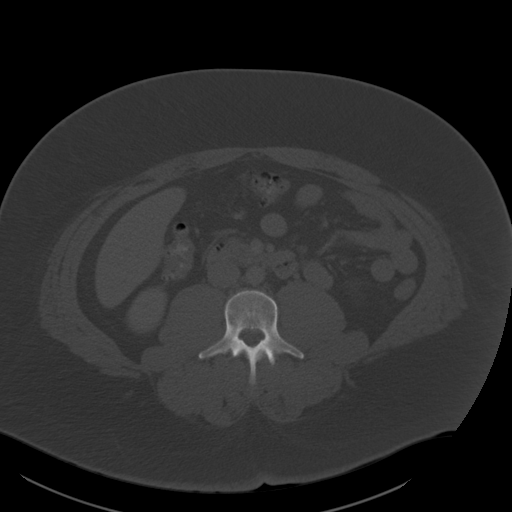
[im 61/91  soft-tissue]
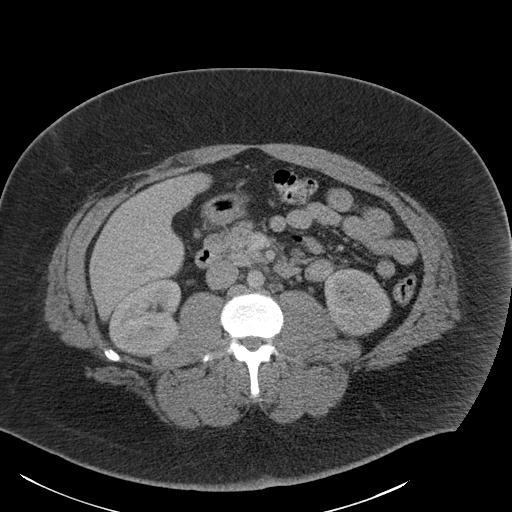
[im 66/91  soft-tissue]
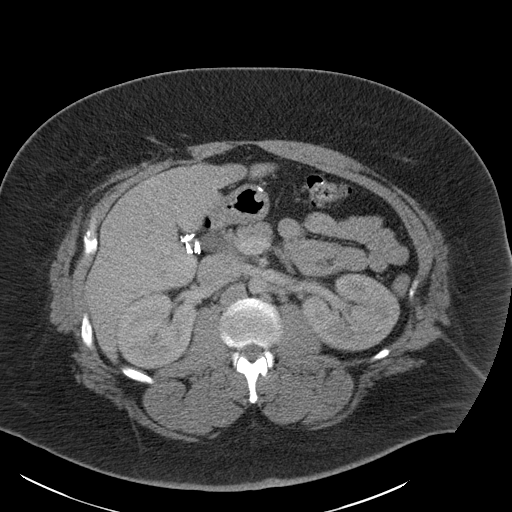
[im 71/91  soft-tissue]
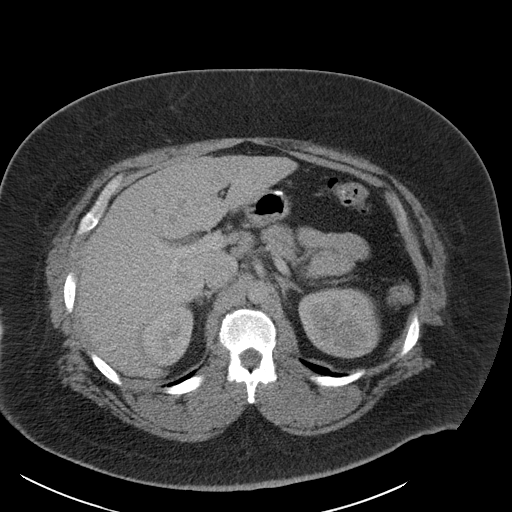
[im 81/91  soft-tissue]
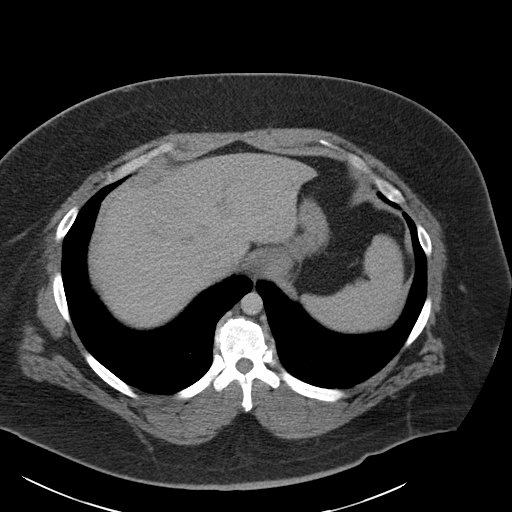
[im 86/91  soft-tissue]
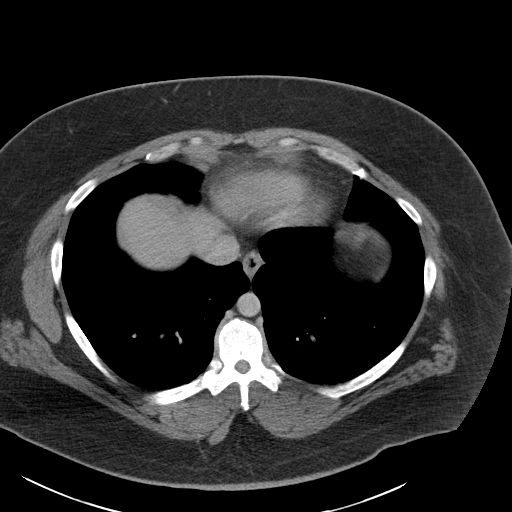

[Series 5: coronal a/|p · coronal · 0.91mm/px · 3 of 179 slices shown]
[im 60/179  soft-tissue]
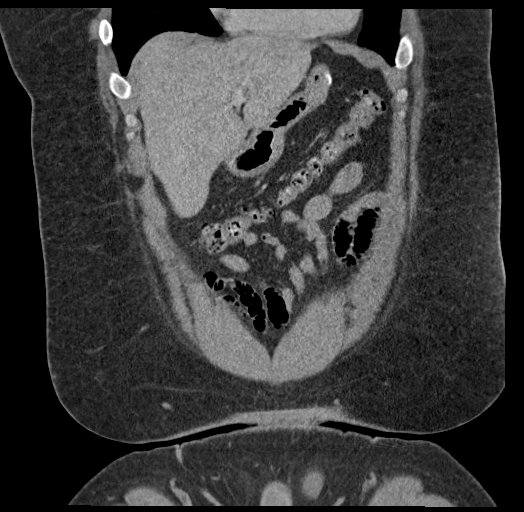
[im 80/179  soft-tissue]
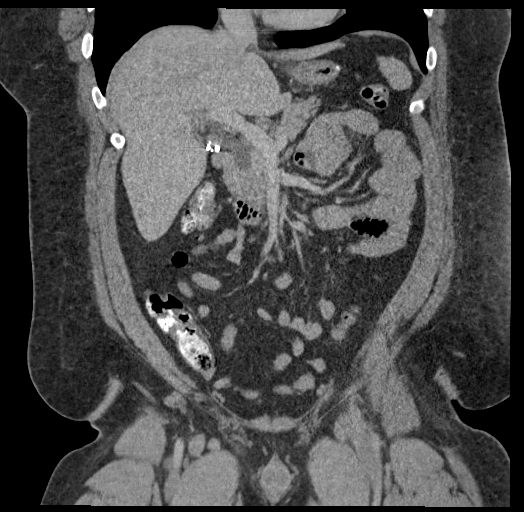
[im 99/179  soft-tissue]
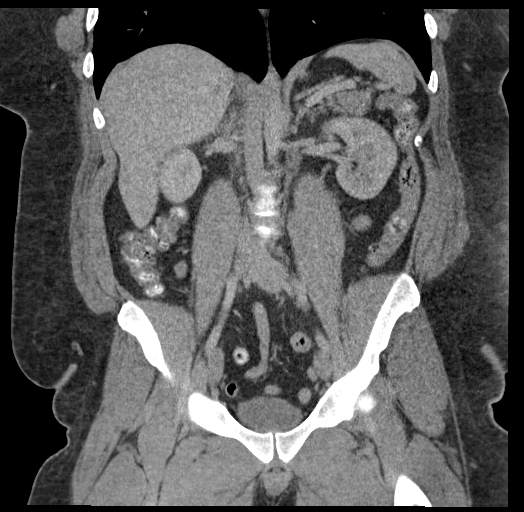

[17 of 46 positions shown; findings below may reference images not displayed]

FINDINGS: The visualized lung bases are free of consolidation and effusion.

The gallbladder is surgically absent. Mild extrahepatic biliary
dilatation is unchanged and likely related to prior cholecystectomy.
The liver is enlarged, measuring 20 cm in length. The spleen,
adrenal glands, kidneys, and pancreas are unremarkable.

Sequelae of interval sleeve gastrectomy are identified. There is no
evidence of bowel obstruction. Scattered colonic diverticula are
noted without evidence of diverticulitis. The appendix is
unremarkable.

Bladder and prostate are unremarkable. No free fluid or enlarged
lymph nodes are identified. Aorta is normal in caliber. Chronic
bilateral L5 pars defects are again seen with extension through the
lamina/base of spinous process. No acute osseous abnormality is
identified.
IMPRESSION: No acute abnormality identified in the abdomen or pelvis.

## 2018-01-24 ENCOUNTER — Encounter (HOSPITAL_COMMUNITY): Payer: Self-pay | Admitting: Emergency Medicine

## 2018-01-24 ENCOUNTER — Emergency Department (HOSPITAL_COMMUNITY)
Admission: EM | Admit: 2018-01-24 | Discharge: 2018-01-25 | Disposition: A | Discharge status: Home / Self Care | Payer: BLUE CROSS/BLUE SHIELD | Attending: Emergency Medicine | Admitting: Emergency Medicine

## 2018-01-24 DIAGNOSIS — Z79899 Other long term (current) drug therapy: Secondary | ICD-10-CM | POA: Insufficient documentation

## 2018-01-24 DIAGNOSIS — R1031 Right lower quadrant pain: Secondary | ICD-10-CM | POA: Diagnosis not present

## 2018-01-24 DIAGNOSIS — J45909 Unspecified asthma, uncomplicated: Secondary | ICD-10-CM | POA: Insufficient documentation

## 2018-01-24 DIAGNOSIS — F17210 Nicotine dependence, cigarettes, uncomplicated: Secondary | ICD-10-CM | POA: Diagnosis not present

## 2018-01-24 DIAGNOSIS — Z9884 Bariatric surgery status: Secondary | ICD-10-CM | POA: Diagnosis not present

## 2018-01-24 LAB — COMPREHENSIVE METABOLIC PANEL
ALT: 20 U/L (ref 0–44)
AST: 26 U/L (ref 15–41)
Albumin: 3.4 g/dL — ABNORMAL LOW (ref 3.5–5.0)
Alkaline Phosphatase: 68 U/L (ref 38–126)
Anion gap: 7 (ref 5–15)
BILIRUBIN TOTAL: 0.6 mg/dL (ref 0.3–1.2)
BUN: 13 mg/dL (ref 6–20)
CHLORIDE: 107 mmol/L (ref 98–111)
CO2: 22 mmol/L (ref 22–32)
CREATININE: 0.92 mg/dL (ref 0.61–1.24)
Calcium: 8.5 mg/dL — ABNORMAL LOW (ref 8.9–10.3)
Glucose, Bld: 96 mg/dL (ref 70–99)
POTASSIUM: 4 mmol/L (ref 3.5–5.1)
Sodium: 136 mmol/L (ref 135–145)
TOTAL PROTEIN: 6.3 g/dL — AB (ref 6.5–8.1)

## 2018-01-24 LAB — CBC
HEMATOCRIT: 36.2 % — AB (ref 39.0–52.0)
Hemoglobin: 10.5 g/dL — ABNORMAL LOW (ref 13.0–17.0)
MCH: 22.1 pg — ABNORMAL LOW (ref 26.0–34.0)
MCHC: 29 g/dL — ABNORMAL LOW (ref 30.0–36.0)
MCV: 76.2 fL — AB (ref 78.0–100.0)
PLATELETS: 253 10*3/uL (ref 150–400)
RBC: 4.75 MIL/uL (ref 4.22–5.81)
RDW: 18.2 % — AB (ref 11.5–15.5)
WBC: 6.3 10*3/uL (ref 4.0–10.5)

## 2018-01-24 LAB — LIPASE, BLOOD: LIPASE: 34 U/L (ref 11–51)

## 2018-01-24 NOTE — ED Triage Notes (Signed)
Pt presents to ED for assessment of generalized abdominal pain with radiation to right flank.  C/o nausea, denies vomiting, denies diarrhea, denies changes in urination.  Patient states hx of gastric bypass with malabsorption issues and similar pain since.  States daughter has been sick wuith fever and abdominal pain this week.

## 2018-01-25 ENCOUNTER — Emergency Department (HOSPITAL_COMMUNITY): Payer: BLUE CROSS/BLUE SHIELD

## 2018-01-25 LAB — URINALYSIS, ROUTINE W REFLEX MICROSCOPIC
BILIRUBIN URINE: NEGATIVE
GLUCOSE, UA: NEGATIVE mg/dL
Hgb urine dipstick: NEGATIVE
KETONES UR: NEGATIVE mg/dL
LEUKOCYTES UA: NEGATIVE
NITRITE: NEGATIVE
PH: 6 (ref 5.0–8.0)
PROTEIN: NEGATIVE mg/dL
Specific Gravity, Urine: 1.027 (ref 1.005–1.030)

## 2018-01-25 MED ORDER — IOPAMIDOL (ISOVUE-300) INJECTION 61%
INTRAVENOUS | Status: AC
  Filled 2018-01-25: qty 100

## 2018-01-25 MED ORDER — ONDANSETRON HCL 4 MG/2ML IJ SOLN
INTRAMUSCULAR | Status: AC
  Filled 2018-01-25: qty 2

## 2018-01-25 MED ORDER — FENTANYL CITRATE (PF) 100 MCG/2ML IJ SOLN
INTRAMUSCULAR | Status: DC
Start: 2018-01-25 — End: 2018-01-25
  Filled 2018-01-25: qty 2

## 2018-01-25 MED ORDER — IOPAMIDOL (ISOVUE-300) INJECTION 61%
100.0000 mL | Freq: Once | INTRAVENOUS | Status: AC | PRN
  Administered 2018-01-25: 100 mL via INTRAVENOUS

## 2018-01-25 MED ORDER — SODIUM CHLORIDE 0.9 % IV BOLUS (SEPSIS)
1000.0000 mL | Freq: Once | INTRAVENOUS | Status: AC
  Administered 2018-01-25: 1000 mL via INTRAVENOUS

## 2018-01-25 MED ORDER — ONDANSETRON HCL 4 MG/2ML IJ SOLN
4.0000 mg | Freq: Once | INTRAMUSCULAR | Status: AC
  Administered 2018-01-25: 4 mg via INTRAVENOUS

## 2018-01-25 MED ORDER — FENTANYL CITRATE (PF) 100 MCG/2ML IJ SOLN
100.0000 ug | Freq: Once | INTRAMUSCULAR | Status: AC
  Administered 2018-01-25: 100 ug via INTRAVENOUS

## 2018-01-25 NOTE — ED Notes (Signed)
Called peds to send pt out to adult lobby.

## 2018-01-25 NOTE — ED Notes (Signed)
Called for treatment room x 1, no answer. 

## 2018-01-25 NOTE — Discharge Instructions (Addendum)

## 2018-01-25 NOTE — ED Provider Notes (Signed)
[facility] EMERGENCY DEPARTMENT Provider Note   CSN: [id number] Arrival date & time: 01/24/18  2241     History   Chief Complaint Chief Complaint  Patient presents with  . Abdominal Pain    HPI Mark Luna is a 37 y.o. male.  The history is provided by the patient.  Abdominal Pain   This is a new problem. The current episode started 12 to 24 hours ago. The problem occurs constantly. The problem has been rapidly worsening. The pain is located in the RLQ (right flank). The pain is severe. Associated symptoms include nausea. Pertinent negatives include fever, diarrhea, hematochezia, melena, vomiting and dysuria. The symptoms are aggravated by certain positions and palpation. Nothing relieves the symptoms.  Patient reports sudden onset of right flank and right lower abdominal pain.  It has been worsening throughout the day.  He reports extreme nausea but no vomiting. No Chest pain.  He reports have this pain previously but is unclear what caused it He had otherwise been feeling well. He denies any history of GI bleed recently.  No new scrotal or testicular pain History of gastric bypass.  History of cholecystectomy.  He does not use NSAIDs  Past Medical History:  Diagnosis Date  . Asthma   . Back pain   . Diverticulitis of colon Jan 2014   Treated with PO ABx  . Obesity, morbid, BMI 50 or higher (HCC) 07/04/2012    Patient Active Problem List   Diagnosis Date Noted  . Spondylolysis, lumbar region 11/06/2016  . Spondylolisthesis, lumbar region 11/06/2016  . Obesity, morbid, BMI 50 or higher (HCC) 07/04/2012  . Internal hemorrhoids with pain/swelling/prolapse 07/04/2012  . H/O diverticulitis of colon 07/04/2012  . Varicocele, left scrotum 07/04/2012    Past Surgical History:  Procedure Laterality Date  . CHOLE[place]OMY  ~2006   [name]   . GASTRIC BYPASS  2014  . HEMORRHOID SURGERY    . WRIST SURGERY          Home Medications    Prior  to Admission medications   Medication Sig Start Date End Date Taking? Authorizing Provider  calcium citrate-vitamin D (CITRACAL+D) 315-200 MG-UNIT per tablet Take 1 tablet by mouth 2 (two) times daily.     [provider]  ciprofloxacin (CIPRO) 500 MG tablet Take 1 tablet (500 mg total) by mouth 2 (two) times daily. 09/17/16   Deatra Canter, FNP  Cyanocobalamin (VITAMIN B 12 PO) Take 1 tablet by mouth daily.    [provider]  HYDROcodone-acetaminophen (NORCO/VICODIN) 5-325 MG tablet Take 1-2 tablets by mouth every 8 (eight) hours as needed for severe pain. 03/26/17   Antony Madura, PA-C  ibuprofen (ADVIL,MOTRIN) 600 MG tablet Take 1 tablet (600 mg total) by mouth every 6 (six) hours as needed. 03/26/17   Antony Madura, PA-C  Iron TABS Take 1 tablet by mouth daily as needed (for supplement).     [provider]  Melatonin 5 MG TABS Take 5 mg by mouth at bedtime as needed (for supplement).     [provider]  meloxicam (MOBIC) 7.5 MG tablet Take 1 tablet (7.5 mg total) by mouth 2 (two) times daily after a meal. 07/21/16   Kindl, Quita Skye, MD  Multiple Vitamin (MULTIVITAMIN WITH MINERALS) TABS tablet Take 1 tablet by mouth 3 (three) times daily.     [provider]  naproxen (NAPROSYN) 375 MG tablet Take 1 tablet (375 mg total) by mouth 2 (two) times daily with a  meal. 09/21/17   Maczis, Elmer Sow, PA-C  ondansetron (ZOFRAN ODT) 4 MG disintegrating tablet Take 1 tablet (4 mg total) by mouth every 8 (eight) hours as needed for nausea or vomiting. 09/06/15   Derwood Kaplan, MD  phenazopyridine (PYRIDIUM) 200 MG tablet Take 1 tablet (200 mg total) by mouth 3 (three) times daily. 09/17/16   Deatra Canter, FNP  tamsulosin (FLOMAX) 0.4 MG CAPS capsule Take 0.4 mg by mouth daily.    [provider]    Family History Family History  Problem Relation Age of Onset  . Diabetes Mother     Social History Social History   Tobacco Use  . Smoking  status: Current Every Day Smoker    Packs/day: 0.50    Types: Cigarettes  . Smokeless tobacco: Former Engineer, water Use Topics  . Alcohol use: Yes    Comment: socially  . Drug use: No     Allergies   Latex   Review of Systems Review of Systems  Constitutional: Negative for fever.  Respiratory: Negative for shortness of breath.   Cardiovascular: Negative for chest pain.  Gastrointestinal: Positive for abdominal pain and nausea. Negative for blood in stool, diarrhea, hematochezia, melena and vomiting.  Genitourinary: Negative for dysuria and testicular pain.  All other systems reviewed and are negative.    Physical Exam Updated Vital Signs BP 120/71 (BP Location: Right Arm)   Pulse 61   Temp 98.4 F (36.9 C)   Resp (!) 22   SpO2 100%   Physical Exam CONSTITUTIONAL: Well developed/well nourished HEAD: Normocephalic/atraumatic EYES: EOMI/PERRL, no icterus ENMT: Mucous membranes moist NECK: supple no meningeal signs SPINE/BACK:entire spine nontender CV: S1/S2 noted, no murmurs/rubs/gallops noted LUNGS: Lungs are clear to auscultation bilaterally, no apparent distress ABDOMEN: soft, moderate right lower quadrant tenderness, limited due to obesity, no rebound or guarding, bowel sounds noted throughout abdomen GU: Right Cva tenderness NEURO: Pt is awake/alert/appropriate, moves all extremitiesx4.  No facial droop.   EXTREMITIES: pulses normal/equal, full ROM SKIN: warm, color normal PSYCH: Anxious  ED Treatments / Results  Labs (all labs ordered are listed, but only abnormal results are displayed) Labs Reviewed  COMPREHENSIVE METABOLIC PANEL - Abnormal; Notable for the following components:      Result Value   Calcium 8.5 (*)    Total Protein 6.3 (*)    Albumin 3.4 (*)    All other components within normal limits  CBC - Abnormal; Notable for the following components:   Hemoglobin 10.5 (*)    HCT 36.2 (*)    MCV 76.2 (*)    MCH 22.1 (*)    MCHC 29.0 (*)     RDW 18.2 (*)    All other components within normal limits  LIPASE, BLOOD  URINALYSIS, ROUTINE W REFLEX MICROSCOPIC    EKG None  Radiology Ct Abdomen Pelvis W Contrast  Result Date: 01/25/2018 CLINICAL DATA:  Generalized abdominal pain, right flank pain, nausea EXAM: CT ABDOMEN AND PELVIS WITH CONTRAST TECHNIQUE: Multidetector CT imaging of the abdomen and pelvis was performed using the standard protocol following bolus administration of intravenous contrast. CONTRAST:  ISOVUE-300 IOPAMIDOL (ISOVUE-300) INJECTION 61% COMPARISON:  09/06/2015 FINDINGS: Lower chest: Lung bases are clear. No effusions. Heart is normal size. Hepatobiliary: No focal liver abnormality is seen. Status post cholecystectomy. No biliary dilatation. Pancreas: No focal abnormality or ductal dilatation. Spleen: No focal abnormality.  Normal size. Adrenals/Urinary Tract: No adrenal abnormality. No focal renal abnormality. No stones or hydronephrosis. Urinary bladder is unremarkable.  Stomach/Bowel: Prior gastric sleeve. Few scattered colonic diverticula. No active diverticulitis. Normal appendix. No evidence of obstruction. Vascular/Lymphatic: No evidence of aneurysm or adenopathy. Reproductive: No visible focal abnormality. Other: No free fluid or free air. Musculoskeletal: No acute bony abnormality. Bilateral L5 pars defects again noted. IMPRESSION: Scattered colonic diverticula.  No active diverticulitis. Prior cholecystectomy and gastric sleeve. No acute findings in the abdomen or pelvis. Electronically Signed   By: Charlett NoseKevin  Dover M.D.   On: 01/25/2018 03:01    Procedures Procedures   Medications Ordered in ED Medications  fentaNYL (SUBLIMAZE) 100 MCG/2ML injection (has no administration in time range)  ondansetron (ZOFRAN) 4 MG/2ML injection (has no administration in time range)  iopamidol (ISOVUE-300) 61 % injection (has no administration in time range)  fentaNYL (SUBLIMAZE) injection 100 mcg (100 mcg Intravenous  Given 01/25/18 0112)  ondansetron (ZOFRAN) injection 4 mg (4 mg Intravenous Given 01/25/18 0117)  sodium chloride 0.9 % bolus 1,000 mL (1,000 mLs Intravenous New Bag/Given 01/25/18 0112)  iopamidol (ISOVUE-300) 61 % injection 100 mL (100 mLs Intravenous Contrast Given 01/25/18 0244)     Initial Impression / Assessment and Plan / ED Course  I have reviewed the triage vital signs and the nursing notes.  Pertinent labs  results that were available during my care of the patient were reviewed by me and considered in my medical decision making (see chart for details).     1:12 AM Patient presents with right flank and right lower abdominal pain.  His exam is very limited due to his obesity.  Labs thus far are reassuring, but will need to obtain urinalysis before proceeding for CT imaging 4:36 AM CT without acute findings.  Patient with mild improvement.  No vomiting. Patient now admits that he gets his pain very frequently, at least several times per month.  He will be referred to gastroenterology for further evaluation.  I discussed CT results at length.  I discussed that there is no acute abdominal emergency at this time Final Clinical Impressions(s) / ED Diagnoses   Final diagnoses:  Right lower quadrant abdominal pain    ED Discharge Orders    None       Zadie RhineWickline, Deyonte Cadden, MD 01/25/18 [phone]

## 2018-01-25 NOTE — ED Notes (Signed)
Pt came to nurses desk, appearing to be falling asleep standing up, stating that he wants the doctor to come to his room since he is feeling better and ready to go home.

## 2018-01-25 NOTE — ED Notes (Signed)
Called for treatment room x2, no answer

## 2018-02-25 ENCOUNTER — Telehealth (INDEPENDENT_AMBULATORY_CARE_PROVIDER_SITE_OTHER): Payer: Self-pay | Admitting: Specialist

## 2018-02-25 NOTE — Telephone Encounter (Signed)
Patient called would like a new Handicap placard. Mark Luna also wanted to know if we had an appointment available anytime soon, I advised patient that we didn't have anything available and I would leave a message for the doctor.

## 2018-02-26 NOTE — Telephone Encounter (Signed)
Okay to renew or he can renew with his primary care MD. Candise Bowens

## 2018-02-27 NOTE — Telephone Encounter (Signed)
Pt aware this is ready for pick up at the front desk

## 2018-05-23 ENCOUNTER — Telehealth (INDEPENDENT_AMBULATORY_CARE_PROVIDER_SITE_OTHER): Payer: Self-pay

## 2018-05-23 NOTE — Telephone Encounter (Signed)
Triage call on voice mail: The patient is experiencing "sciatic nerve pain throughout legs, buttocks and calf muscles" x 3 days. He said the pain is "very intense" today and would like to be seen Mark Luna([name] patient - last seen 2018. I called the patient back - Dr. Otelia SergeantNitka is out of clinic due to family emergency and it is unknown how long he will be away from the clinic.  No other appointments available with other providers today (limited few providers in the office).  Advised the patient that if he is in that much pain, he should go to the ED to be evaluated.  He voiced understanding and will proceed to the ED.

## 2018-05-24 ENCOUNTER — Ambulatory Visit (HOSPITAL_COMMUNITY)
Admission: EM | Admit: 2018-05-24 | Discharge: 2018-05-24 | Disposition: A | Discharge status: Home / Self Care | Payer: BLUE CROSS/BLUE SHIELD | Attending: Family Medicine | Admitting: Family Medicine

## 2018-05-24 ENCOUNTER — Encounter (HOSPITAL_COMMUNITY): Payer: Self-pay

## 2018-05-24 DIAGNOSIS — Z9884 Bariatric surgery status: Secondary | ICD-10-CM | POA: Diagnosis not present

## 2018-05-24 DIAGNOSIS — M5386 Other specified dorsopathies, lumbar region: Secondary | ICD-10-CM

## 2018-05-24 MED ORDER — TIZANIDINE HCL 4 MG PO TABS: 4.0000 mg | ORAL_TABLET | Freq: Four times a day (QID) | ORAL | 0 refills | Status: DC | PRN

## 2018-05-24 MED ORDER — HYDROCODONE-ACETAMINOPHEN 5-325 MG PO TABS: 1.0000 | ORAL_TABLET | Freq: Four times a day (QID) | ORAL | 0 refills | Status: DC | PRN

## 2018-05-24 MED ORDER — KETOROLAC TROMETHAMINE 60 MG/2ML IM SOLN
INTRAMUSCULAR | Status: AC
  Filled 2018-05-24: qty 2

## 2018-05-24 MED ORDER — METHYLPREDNISOLONE 4 MG PO TBPK: ORAL_TABLET | ORAL | 0 refills | Status: DC

## 2018-05-24 MED ORDER — KETOROLAC TROMETHAMINE 60 MG/2ML IM SOLN
60.0000 mg | Freq: Once | INTRAMUSCULAR | Status: AC
  Administered 2018-05-24: 60 mg via INTRAMUSCULAR

## 2018-05-24 NOTE — ED Triage Notes (Signed)
Pt presents with chronic back pain , neck pain and shoulder pain.  Pt states he sees a orthopedic for chronic issue but his doctor is out and unsure when they will be back to see him.

## 2018-05-24 NOTE — Discharge Instructions (Signed)
Activity as tolerated.  Continue to move around as much as you can tolerate intake short walks.  Avoid bedrest.  Sit, stand, or lay down as your pain will allow Take the Medrol Dosepak as prescribed.  Take all of day 1 today Take pain medication and muscle relaxers as needed. Pain medication can cause drowsiness, and can cause constipation.  Do not drive on the pain medication. Follow-up with your spine specialist next week

## 2018-05-26 NOTE — ED Provider Notes (Signed)
[facility]    CSN: [id number] Arrival date & time: 05/24/18  1428     History   Chief Complaint Chief Complaint  Patient presents with  . Back Pain    Sciatic  . Neck Pain  . Shoulder Pain    HPI Mark Luna is a 37 y.o. male.   HPI  Here for low back pain radiating down the back of his right leg.  He has a long history of back problems that he feels are related to an old work injury.  He has not seen his orthopedic for over a year.  No numbness in leg.  No weakness in leg or falls.  No bowel or bladder problems.  No new injury.  NO overuse or cause for current flare.  He hs had gastric surgery and it is not advised that he take NSAID medicine.  He has tried acetaminophen without improvement.     Past Medical History:  Diagnosis Date  . Asthma   . Back pain   . Diverticulitis of colon Jan 2014   Treated with PO ABx  . Obesity, morbid, BMI 50 or higher (HCC) 07/04/2012    Patient Active Problem List   Diagnosis Date Noted  . Spondylolysis, lumbar region 11/06/2016  . Spondylolisthesis, lumbar region 11/06/2016  . Obesity, morbid, BMI 50 or higher (HCC) 07/04/2012  . Internal hemorrhoids with pain/swelling/prolapse 07/04/2012  . H/O diverticulitis of colon 07/04/2012  . Varicocele, left scrotum 07/04/2012    Past Surgical History:  Procedure Laterality Date  . CHOLE[place]OMY  ~2006   [name]   . GASTRIC BYPASS  2014  . HEMORRHOID SURGERY    . WRIST SURGERY         Home Medications    Prior to Admission medications   Medication Sig Start Date End Date Taking? Authorizing Provider  calcium citrate-vitamin D (CITRACAL+D) 315-200 MG-UNIT per tablet Take 1 tablet by mouth 2 (two) times daily.     [provider]  Cyanocobalamin (VITAMIN B 12 PO) Take 1 tablet by mouth daily.    [provider]  HYDROcodone-acetaminophen (NORCO/VICODIN) 5-325 MG tablet Take 1-2 tablets by mouth every 6 (six) hours as needed for severe  pain. 05/24/18   [name], Kevontae Burgoon Sue, MD  Iron TABS Take 1 tablet by mouth daily as needed (for supplement).     [provider]  Melatonin 5 MG TABS Take 5 mg by mouth at bedtime as needed (for supplement).     [provider]  methylPREDNISolone (MEDROL DOSEPAK) 4 MG TBPK tablet tad 05/24/18   [name], [name], MD  Multiple Vitamin (MULTIVITAMIN WITH MINERALS) TABS tablet Take 1 tablet by mouth 3 (three) times daily.     [provider]  ondansetron (ZOFRAN ODT) 4 MG disintegrating tablet Take 1 tablet (4 mg total) by mouth every 8 (eight) hours as needed for nausea or vomiting. 09/06/15   Derwood KaplanNanavati, Ankit, MD  tamsulosin (FLOMAX) 0.4 MG CAPS capsule Take 0.4 mg by mouth daily.    [provider]  tiZANidine (ZANAFLEX) 4 MG tablet Take 1-2 tablets (4-8 mg total) by mouth every 6 (six) hours as needed for muscle spasms. 05/24/18   [name], Chaniqua Brisby Sue, MD    Family History Family History  Problem Relation Age of Onset  . Diabetes Mother     Social History Social History   Tobacco Use  . Smoking status: Current Every Day Smoker    Packs/day: 0.50    Types: Cigarettes  .  Smokeless tobacco: Former Engineer, waterUser  Substance Use Topics  . Alcohol use: Yes    Comment: socially  . Drug use: No     Allergies   Latex   Review of Systems Review of Systems  Constitutional: Negative for chills and fever.  HENT: Negative for ear pain and sore throat.   Eyes: Negative for pain and visual disturbance.  Respiratory: Negative for cough and shortness of breath.   Cardiovascular: Negative for chest pain and palpitations.  Gastrointestinal: Negative for abdominal pain and vomiting.  Genitourinary: Negative for dysuria and hematuria.  Musculoskeletal: Positive for back pain. Negative for arthralgias.  Skin: Negative for color change and rash.  Neurological: Negative for seizures and syncope.  All other systems reviewed and are negative.    Physical Exam Triage  Vital Signs ED Triage Vitals  Enc Vitals Group     BP 05/24/18 1458 (!) 149/94     Pulse Rate 05/24/18 1458 65     Resp 05/24/18 1458 20     Temp 05/24/18 1458 97.9 F (36.6 C)     Temp Source 05/24/18 1458 Oral     SpO2 05/24/18 1458 99 %     Weight --      Height --      Head Circumference --      Peak Flow --      Pain Score 05/24/18 1457 10     Pain Loc --      Pain Edu? --      Excl. in GC? --    No data found.  Updated Vital Signs BP (!) 149/94 (BP Location: Right Arm)   Pulse 65   Temp 97.9 F (36.6 C) (Oral)   Resp 20   SpO2 99%     Physical Exam Constitutional:      General: He is not in acute distress.    Appearance: He is well-developed. He is obese.     Comments: Super obese.  Appears uncomfortable  HENT:     Head: Normocephalic and atraumatic.  Eyes:     Conjunctiva/sclera: Conjunctivae normal.     Pupils: Pupils are equal, round, and reactive to light.  Neck:     Musculoskeletal: Normal range of motion.  Cardiovascular:     Rate and Rhythm: Normal rate and regular rhythm.     Heart sounds: Normal heart sounds.  Pulmonary:     Effort: Pulmonary effort is normal. No respiratory distress.     Breath sounds: Normal breath sounds.  Abdominal:     General: There is no distension.     Palpations: Abdomen is soft.  Musculoskeletal: Normal range of motion.     Comments: Tender right lumbar muscles.  No bone tenderness.  SLR causes pain down back of the thigh to knee.  Reflexes are faint at knee and ankle bilat.  Symmetric strength  Skin:    General: Skin is warm and dry.  Neurological:     Mental Status: He is alert.      UC Treatments / Results  Labs (all labs ordered are listed, but only abnormal results are displayed) Labs Reviewed - No data to display  EKG None  Radiology No results found.  Procedures Procedures (including critical care time)  Medications Ordered in UC Medications  ketorolac (TORADOL) injection 60 mg (60 mg  Intramuscular Given 05/24/18 1626)    Initial Impression / Assessment and Plan / UC Course  I have reviewed the triage vital signs and the nursing notes.  Pertinent  labs & imaging results that were available during my care of the patient were reviewed by me and considered in my medical decision making (see chart for details).    No sign of cauda equina or worrisome symptoms.  Chronic pain with acute exacerbation.  No focal neuro findings.   Final Clinical Impressions(s) / UC Diagnoses   Final diagnoses:  Sciatica associated with disorder of lumbar spine     Discharge Instructions     Activity as tolerated.  Continue to move around as much as you can tolerate intake short walks.  Avoid bedrest.  Sit, stand, or lay down as your pain will allow Take the Medrol Dosepak as prescribed.  Take all of day 1 today Take pain medication and muscle relaxers as needed. Pain medication can cause drowsiness, and can cause constipation.  Do not drive on the pain medication. Follow-up with your spine specialist next week   ED Prescriptions    Medication Sig Dispense Auth. Provider   methylPREDNISolone (MEDROL DOSEPAK) 4 MG TBPK tablet tad 21 tablet Eustace Moore, MD   tiZANidine (ZANAFLEX) 4 MG tablet Take 1-2 tablets (4-8 mg total) by mouth every 6 (six) hours as needed for muscle spasms. 21 tablet Eustace Moore, MD   HYDROcodone-acetaminophen (NORCO/VICODIN) 5-325 MG tablet Take 1-2 tablets by mouth every 6 (six) hours as needed for severe pain. 20 tablet Eustace Moore, MD     Controlled Substance Prescriptions  Controlled Substance Registry consulted? Yes.  Prescription favorable.   Eustace Moore, MD 05/26/18 [phone]

## 2018-06-20 ENCOUNTER — Encounter (HOSPITAL_COMMUNITY): Payer: Self-pay

## 2018-06-20 ENCOUNTER — Ambulatory Visit (HOSPITAL_COMMUNITY)
Admission: EM | Admit: 2018-06-20 | Discharge: 2018-06-20 | Disposition: A | Discharge status: Home / Self Care | Payer: BLUE CROSS/BLUE SHIELD | Attending: Family Medicine | Admitting: Family Medicine

## 2018-06-20 DIAGNOSIS — B356 Tinea cruris: Secondary | ICD-10-CM | POA: Diagnosis not present

## 2018-06-20 DIAGNOSIS — Z711 Person with feared health complaint in whom no diagnosis is made: Secondary | ICD-10-CM | POA: Insufficient documentation

## 2018-06-20 NOTE — ED Triage Notes (Signed)
Pt present coughing, diarrhea and a rash. Symptoms started 4 days ago.  Pt states the rash is in his groin area and its very itchy.

## 2018-06-20 NOTE — Discharge Instructions (Signed)
Apply over the counter clotrimazole cream twice daily for the next 1-2 weeks. This should help if you have jock itch.

## 2018-06-25 NOTE — ED Provider Notes (Signed)
[facility]   [id number] 06/20/18 Arrival Time: 1059  ASSESSMENT & PLAN:  1. Tinea cruris   2. Concern about STD in male without diagnosis    No signs of genital herpes. Reassured.   Discharge Instructions     Apply over the counter clotrimazole cream twice daily for the next 1-2 weeks. This should help if you have jock itch.    Will notify of any positive results. Instructed to refrain from sexual activity for at least seven days.  Reviewed expectations re: course of current medical issues. Questions answered. Outlined signs and symptoms indicating need for more acute intervention. Patient verbalized understanding. After Visit Summary given.   SUBJECTIVE:  Mark Luna is a 38 y.o. male who presents with concern over rash/itching of inner thighs. Voices concern over genital herpes. Partner has. He has no painful lesions of genitalia. No penile d/c. No h/o STD. Rash present for the past week. Mostly itching now. No OTC tx.  ROS: As per HPI.  OBJECTIVE:  Vitals:   06/20/18 1201  BP: (!) 153/73  Pulse: 73  Resp: 20  Temp: 98.6 F (37 C)  SpO2: 100%     General appearance: alert, cooperative, appears stated age and no distress Throat: lips, mucosa, and tongue normal; teeth and gums normal CV: RRR Lungs: CTAB Back: no CVA tenderness; FROM at waist Abdomen: soft, non-tender GU: tinea cruris; no sign of genital herpes Skin: warm and dry Psychological: alert and cooperative; normal mood and affect.   Labs Reviewed - No data to display  Allergies  Allergen Reactions  . Latex Hives, Rash and Other (See Comments)    Powder in latex gloves     Past Medical History:  Diagnosis Date  . Asthma   . Back pain   . Diverticulitis of colon Jan 2014   Treated with PO ABx  . Obesity, morbid, BMI 50 or higher (HCC) 07/04/2012   Family History  Problem Relation Age of Onset  . Diabetes Mother    Social History   Socioeconomic History  . Marital  status: Single    Spouse name: Not on file  . Number of children: Not on file  . Years of education: Not on file  . Highest education level: Not on file  Occupational History  . Not on file  Social Needs  . Financial resource strain: Not on file  . Food insecurity:    Worry: Not on file    Inability: Not on file  . Transportation needs:    Medical: Not on file    Non-medical: Not on file  Tobacco Use  . Smoking status: Current Every Day Smoker    Packs/day: 0.50    Types: Cigarettes  . Smokeless tobacco: Former Engineer, waterUser  Substance and Sexual Activity  . Alcohol use: Yes    Comment: socially  . Drug use: No  . Sexual activity: Not on file  Lifestyle  . Physical activity:    Days per week: Not on file    Minutes per session: Not on file  . Stress: Not on file  Relationships  . Social connections:    Talks on phone: Not on file    Gets together: Not on file    Attends religious service: Not on file    Active member of club or organization: Not on file    Attends meetings of clubs or organizations: Not on file    Relationship status: Not on file  . Intimate partner violence:  Fear of current or ex partner: Not on file    Emotionally abused: Not on file    Physically abused: Not on file    Forced sexual activity: Not on file  Other Topics Concern  . Not on file  Social History Narrative  . Not on file          Mardella LaymanHagler, Vora Clover, MD 06/25/18 [phone]

## 2018-10-25 ENCOUNTER — Ambulatory Visit: Payer: BLUE CROSS/BLUE SHIELD | Admitting: Specialist

## 2018-11-22 ENCOUNTER — Telehealth: Payer: Self-pay | Admitting: Specialist

## 2018-11-22 NOTE — Telephone Encounter (Signed)
Pt called in requesting for his handicap sticker to be renewed, and is wondering if dr.nitka can take care of that for him?  [phone]

## 2018-11-28 NOTE — Telephone Encounter (Signed)
waitng for Dr. Louanne Skye to approve.

## 2018-12-23 ENCOUNTER — Ambulatory Visit: Payer: BLUE CROSS/BLUE SHIELD | Admitting: Specialist

## 2019-06-23 ENCOUNTER — Telehealth: Payer: Self-pay | Admitting: Specialist

## 2019-06-23 NOTE — Telephone Encounter (Signed)
I called and advised that the form has been approved and he will pick up at the front desk

## 2019-06-23 NOTE — Telephone Encounter (Signed)
Patient called. He would like to know if Dr. Otelia Sergeant could renew his handicap sign. His call back number is [phone]

## 2019-08-22 ENCOUNTER — Ambulatory Visit: Payer: BLUE CROSS/BLUE SHIELD | Attending: Internal Medicine

## 2019-08-22 DIAGNOSIS — Z20822 Contact with and (suspected) exposure to covid-19: Secondary | ICD-10-CM

## 2019-08-23 LAB — SARS-COV-2, NAA 2 DAY TAT

## 2019-08-23 LAB — NOVEL CORONAVIRUS, NAA: SARS-CoV-2, NAA: NOT DETECTED

## 2019-09-11 ENCOUNTER — Telehealth: Payer: Self-pay | Admitting: Specialist

## 2019-09-11 NOTE — Telephone Encounter (Signed)
Patient called asked if Dr Otelia Sergeant will fill out FMLA paper work for him just in case he is out of work. Patient was last seen July 2018. The number to contact patient is [phone]

## 2019-09-11 NOTE — Telephone Encounter (Signed)
I called and spoke with patient, I advised that we would have to see him to get the form done as we have not seen him since 11/2016----he asked if we took [employer] for [facility], I advised that we would be out of pocket, he states that he will just go somewhere else since he don't have money laying around to pay the out of pocket costs.

## 2019-11-11 ENCOUNTER — Other Ambulatory Visit: Payer: Self-pay | Admitting: Family Medicine

## 2019-11-11 ENCOUNTER — Ambulatory Visit: Payer: Self-pay

## 2019-11-11 ENCOUNTER — Other Ambulatory Visit: Payer: Self-pay

## 2019-11-11 DIAGNOSIS — M79672 Pain in left foot: Secondary | ICD-10-CM

## 2019-11-11 DIAGNOSIS — M25572 Pain in left ankle and joints of left foot: Secondary | ICD-10-CM

## 2020-02-25 ENCOUNTER — Encounter (HOSPITAL_COMMUNITY): Payer: Self-pay | Admitting: Emergency Medicine

## 2020-02-25 ENCOUNTER — Emergency Department (HOSPITAL_COMMUNITY)
Admission: EM | Admit: 2020-02-25 | Discharge: 2020-02-26 | Disposition: A | Discharge status: Home / Self Care | Payer: BLUE CROSS/BLUE SHIELD | Attending: Emergency Medicine | Admitting: Emergency Medicine

## 2020-02-25 ENCOUNTER — Other Ambulatory Visit: Payer: Self-pay

## 2020-02-25 ENCOUNTER — Emergency Department (HOSPITAL_COMMUNITY): Payer: BLUE CROSS/BLUE SHIELD

## 2020-02-25 DIAGNOSIS — M79662 Pain in left lower leg: Secondary | ICD-10-CM | POA: Insufficient documentation

## 2020-02-25 DIAGNOSIS — M79661 Pain in right lower leg: Secondary | ICD-10-CM | POA: Insufficient documentation

## 2020-02-25 DIAGNOSIS — R0602 Shortness of breath: Secondary | ICD-10-CM | POA: Diagnosis not present

## 2020-02-25 DIAGNOSIS — Z5321 Procedure and treatment not carried out due to patient leaving prior to being seen by health care provider: Secondary | ICD-10-CM | POA: Diagnosis not present

## 2020-02-25 LAB — CBC
HCT: 36.1 % — ABNORMAL LOW (ref 39.0–52.0)
Hemoglobin: 10.5 g/dL — ABNORMAL LOW (ref 13.0–17.0)
MCH: 19.7 pg — ABNORMAL LOW (ref 26.0–34.0)
MCHC: 29.1 g/dL — ABNORMAL LOW (ref 30.0–36.0)
MCV: 67.6 fL — ABNORMAL LOW (ref 80.0–100.0)
Platelets: 339 10*3/uL (ref 150–400)
RBC: 5.34 MIL/uL (ref 4.22–5.81)
RDW: 22.2 % — ABNORMAL HIGH (ref 11.5–15.5)
WBC: 11.6 10*3/uL — ABNORMAL HIGH (ref 4.0–10.5)
nRBC: 0 % (ref 0.0–0.2)

## 2020-02-25 LAB — BASIC METABOLIC PANEL
Anion gap: 9 (ref 5–15)
BUN: 16 mg/dL (ref 6–20)
CO2: 22 mmol/L (ref 22–32)
Calcium: 8.8 mg/dL — ABNORMAL LOW (ref 8.9–10.3)
Chloride: 107 mmol/L (ref 98–111)
Creatinine, Ser: 1.07 mg/dL (ref 0.61–1.24)
GFR calc Af Amer: >60 mL/min (ref >60)
GFR calc non Af Amer: >60 mL/min (ref >60)
Glucose, Bld: 103 mg/dL — ABNORMAL HIGH (ref 70–99)
Potassium: 4.2 mmol/L (ref 3.5–5.1)
Sodium: 138 mmol/L (ref 135–145)

## 2020-02-25 LAB — PROTIME-INR
INR: 1.4 — ABNORMAL HIGH (ref 0.8–1.2)
Prothrombin Time: 16.2 seconds — ABNORMAL HIGH (ref 11.4–15.2)

## 2020-02-25 MED ORDER — ALBUTEROL SULFATE (2.5 MG/3ML) 0.083% IN NEBU: 5.0000 mg | INHALATION_SOLUTION | Freq: Once | RESPIRATORY_TRACT | Status: DC

## 2020-02-25 NOTE — ED Triage Notes (Signed)
Pt presents to ED POV. Pt c/o BLE pain. Pt reports that he was recently diagnosed with DVT in L leg and PE's on 02/13/2020. Pt also c/o SHOB.

## 2020-02-26 NOTE — ED Notes (Signed)
Pt called twice for VS, no response.

## 2020-04-05 ENCOUNTER — Other Ambulatory Visit: Payer: Self-pay | Admitting: Orthopedic Surgery

## 2020-04-05 DIAGNOSIS — S92252G Displaced fracture of navicular [scaphoid] of left foot, subsequent encounter for fracture with delayed healing: Secondary | ICD-10-CM

## 2020-04-26 ENCOUNTER — Other Ambulatory Visit: Payer: Self-pay

## 2020-04-26 ENCOUNTER — Ambulatory Visit
Admission: RE | Admit: 2020-04-26 | Discharge: 2020-04-26 | Disposition: A | Discharge status: Home / Self Care | Payer: BLUE CROSS/BLUE SHIELD | Source: Ambulatory Visit | Attending: Orthopedic Surgery | Admitting: Orthopedic Surgery

## 2020-04-26 DIAGNOSIS — S92252G Displaced fracture of navicular [scaphoid] of left foot, subsequent encounter for fracture with delayed healing: Secondary | ICD-10-CM

## 2022-06-08 ENCOUNTER — Encounter (HOSPITAL_COMMUNITY): Payer: Self-pay | Admitting: Orthopedic Surgery

## 2022-06-08 ENCOUNTER — Other Ambulatory Visit: Payer: Self-pay | Admitting: Orthopedic Surgery

## 2022-06-09 ENCOUNTER — Encounter (HOSPITAL_COMMUNITY): Payer: Self-pay | Admitting: Orthopedic Surgery

## 2022-06-09 ENCOUNTER — Other Ambulatory Visit: Payer: Self-pay

## 2022-06-09 NOTE — Progress Notes (Signed)
For Anesthesia: PCP - Antonieta Pert, FNP, [place], [place], [place], [place],   Pulmonologist-Luke Kathrynn Humble, MD, [place], [place]  Chest x-ray - greater than 1 year EKG - greater than 1 year Stress Test - N/A ECHO - N/A Cardiac Cath - N/A Pacemaker/ICD device last checked: N/A Pacemaker orders received: N/A Device Rep notified: N/A  Spinal Cord Stimulator: N/A  Sleep Study - Yes CPAP - No  Fasting Blood Sugar - N/A Checks Blood Sugar ___N/A__ times a day Date and result of last Hgb A1c- N/A  Last dose of GLP1 agonist- N/A GLP1 instructions: N/A  Last dose of SGLT-2 inhibitors- N/A SGLT-2 instructions:N/A  Blood Thinner Instructions: N/A Aspirin Instructions: N/A Last Dose: N/A  Activity level: Can go up a flight of stairs and activities of daily living without stopping and without chest pain and/or shortness of breath    Anesthesia review: N/A  Patient denies shortness of breath, fever, cough and chest pain at PAT appointment   Patient verbalized understanding of instructions reviewed via telephone.

## 2022-06-11 NOTE — H&P (Signed)
A pre op hand p   Chief Complaint: left knee pain  HPI: Mark Luna is a 42 y.o. male who presents for evaluation of left knee pain. It has been present for greater than 3 months and has been worsening. He has failed conservative measures. Pain is rated as moderate.  Past Medical History:  Diagnosis Date   Anemia    Arthritis    Asthma    Back pain    Carpal tunnel syndrome    Bil   Diverticulitis of colon 05/29/2012   Treated with PO ABx   DVT (deep venous thrombosis) (HCC)    Left leg   Dyslexia    GERD (gastroesophageal reflux disease)    History of kidney stones    Obesity, morbid, BMI 50 or higher ([place]) 07/04/2012   PONV (postoperative nausea and vomiting)    Poor circulation of extremity    Left foot to above ankle after fracture, cold   Pre-diabetes    Priapism    Pulmonary embolism (HCC)    Right lung   Sleep apnea    Past Surgical History:  Procedure Laterality Date   CHOLECYSTECTOMY  ~2006   [name]    COLONOSCOPY     GASTRIC BYPASS  05/29/2012   HEMORRHOID SURGERY     UPPER GASTROINTESTINAL ENDOSCOPY     WRIST SURGERY Right    Social History   Socioeconomic History   Marital status: Single    Spouse name: Not on file   Number of children: Not on file   Years of education: Not on file   Highest education level: Not on file  Occupational History   Not on file  Tobacco Use   Smoking status: Every Day    Packs/day: 0.50    Years: 25.00    Total pack years: 12.50    Types: Cigarettes   Smokeless tobacco: Never   Tobacco comments:    25 year off and on  Vaping Use   Vaping Use: Former  Substance and Sexual Activity   Alcohol use: Yes    Comment: weekends   Drug use: No   Sexual activity: Not on file  Other Topics Concern   Not on file  Social History Narrative   Not on file   Social Determinants of Health   Financial Resource Strain: Not on file  Food Insecurity: Not on file  Transportation Needs: Not on file  Physical  Activity: Not on file  Stress: Not on file  Social Connections: Not on file   Family History  Problem Relation Age of Onset   Diabetes Mother    Allergies  Allergen Reactions   Latex Hives, Rash and Other (See Comments)    Powder in latex gloves    Prior to Admission medications   Medication Sig Start Date End Date Taking? Authorizing Provider  Cyanocobalamin (VITAMIN B 12 PO) Take 1 tablet by mouth daily.   Yes [provider]  omeprazole (PRILOSEC) 40 MG capsule Take 40 mg by mouth daily.   Yes [provider]     Positive ROS: none  All other systems have been reviewed and were otherwise negative with the exception of those mentioned in the HPI and as above.  Physical Exam: There were no vitals filed for this visit.  General: Alert, no acute distress Cardiovascular: No pedal edema Respiratory: No cyanosis, no use of accessory musculature GI: No organomegaly, abdomen is soft and non-tender Skin: No lesions in the area of chief complaint Neurologic:  Sensation intact distally Psychiatric: Patient is competent for consent with normal mood and affect Lymphatic: No axillary or cervical lymphadenopathy  MUSCULOSKELETAL: left knee: Tender palpation lateral joint line.  Positive McMurray.  No instability.  Trace effusion.  Tender to palpation over the anterolateral joint line in the area of the  Meniscal cyst  Assessment/Plan: LEFT KNEE LATERAL MENISCAL TEAR, PERI MENISCAL CYST Plan for Procedure(s): ARTHROSCOPY KNEE WITH OPEN CYST EXCISION  The risks benefits and alternatives were discussed with the patient including but not limited to the risks of nonoperative treatment, versus surgical intervention including infection, bleeding, nerve injury, malunion, nonunion, hardware prominence, hardware failure, need for hardware removal, blood clots, cardiopulmonary complications, morbidity, mortality, among others, and they were willing to proceed.  Predicted outcome  is good, although there will be at least a six to nine month expected recovery.  Alta Corning, MD 06/11/2022 10:26 PM

## 2022-06-12 ENCOUNTER — Encounter (HOSPITAL_COMMUNITY): Payer: Self-pay | Admitting: Orthopedic Surgery

## 2022-06-12 ENCOUNTER — Other Ambulatory Visit: Payer: Self-pay

## 2022-06-12 ENCOUNTER — Ambulatory Visit (HOSPITAL_COMMUNITY): Payer: Worker's Compensation | Admitting: Anesthesiology

## 2022-06-12 ENCOUNTER — Ambulatory Visit (HOSPITAL_COMMUNITY)
Admission: RE | Admit: 2022-06-12 | Discharge: 2022-06-12 | Disposition: A | Discharge status: Home / Self Care | Payer: Worker's Compensation | Attending: Orthopedic Surgery | Admitting: Orthopedic Surgery

## 2022-06-12 ENCOUNTER — Encounter (HOSPITAL_COMMUNITY)
Admission: RE | Disposition: A | Discharge status: Home / Self Care | Payer: Self-pay | Source: Home / Self Care | Attending: Orthopedic Surgery

## 2022-06-12 ENCOUNTER — Ambulatory Visit (HOSPITAL_BASED_OUTPATIENT_CLINIC_OR_DEPARTMENT_OTHER): Payer: Worker's Compensation | Admitting: Anesthesiology

## 2022-06-12 DIAGNOSIS — F17210 Nicotine dependence, cigarettes, uncomplicated: Secondary | ICD-10-CM | POA: Insufficient documentation

## 2022-06-12 DIAGNOSIS — S83282A Other tear of lateral meniscus, current injury, left knee, initial encounter: Secondary | ICD-10-CM | POA: Diagnosis present

## 2022-06-12 DIAGNOSIS — G4730 Sleep apnea, unspecified: Secondary | ICD-10-CM | POA: Insufficient documentation

## 2022-06-12 DIAGNOSIS — M2242 Chondromalacia patellae, left knee: Secondary | ICD-10-CM | POA: Diagnosis not present

## 2022-06-12 DIAGNOSIS — Z6841 Body Mass Index (BMI) 40.0 and over, adult: Secondary | ICD-10-CM | POA: Insufficient documentation

## 2022-06-12 DIAGNOSIS — J45909 Unspecified asthma, uncomplicated: Secondary | ICD-10-CM

## 2022-06-12 DIAGNOSIS — X58XXXA Exposure to other specified factors, initial encounter: Secondary | ICD-10-CM | POA: Insufficient documentation

## 2022-06-12 DIAGNOSIS — M6752 Plica syndrome, left knee: Secondary | ICD-10-CM | POA: Diagnosis present

## 2022-06-12 DIAGNOSIS — K219 Gastro-esophageal reflux disease without esophagitis: Secondary | ICD-10-CM | POA: Insufficient documentation

## 2022-06-12 DIAGNOSIS — M23007 Cystic meniscus, unspecified meniscus, left knee: Secondary | ICD-10-CM | POA: Diagnosis not present

## 2022-06-12 DIAGNOSIS — D649 Anemia, unspecified: Secondary | ICD-10-CM

## 2022-06-12 DIAGNOSIS — F17200 Nicotine dependence, unspecified, uncomplicated: Secondary | ICD-10-CM

## 2022-06-12 DIAGNOSIS — Z86718 Personal history of other venous thrombosis and embolism: Secondary | ICD-10-CM | POA: Diagnosis not present

## 2022-06-12 DIAGNOSIS — M25862 Other specified joint disorders, left knee: Secondary | ICD-10-CM | POA: Diagnosis present

## 2022-06-12 DIAGNOSIS — R7303 Prediabetes: Secondary | ICD-10-CM

## 2022-06-12 DIAGNOSIS — M94262 Chondromalacia, left knee: Secondary | ICD-10-CM | POA: Diagnosis present

## 2022-06-12 HISTORY — DX: Personal history of urinary calculi: Z87.442

## 2022-06-12 HISTORY — DX: Prediabetes: R73.03

## 2022-06-12 HISTORY — DX: Acute embolism and thrombosis of unspecified deep veins of unspecified lower extremity: I82.409

## 2022-06-12 HISTORY — DX: Gastro-esophageal reflux disease without esophagitis: K21.9

## 2022-06-12 HISTORY — DX: Carpal tunnel syndrome, unspecified upper limb: G56.00

## 2022-06-12 HISTORY — PX: KNEE ARTHROSCOPY: SHX127

## 2022-06-12 HISTORY — DX: Other pulmonary embolism without acute cor pulmonale: I26.99

## 2022-06-12 HISTORY — DX: Dyslexia and alexia: R48.0

## 2022-06-12 HISTORY — DX: Sleep apnea, unspecified: G47.30

## 2022-06-12 HISTORY — DX: Nausea with vomiting, unspecified: Z98.890

## 2022-06-12 HISTORY — DX: Nausea with vomiting, unspecified: R11.2

## 2022-06-12 HISTORY — DX: Unspecified osteoarthritis, unspecified site: M19.90

## 2022-06-12 HISTORY — DX: Anemia, unspecified: D64.9

## 2022-06-12 HISTORY — DX: Other specified symptoms and signs involving the circulatory and respiratory systems: R09.89

## 2022-06-12 HISTORY — DX: Priapism, unspecified: N48.30

## 2022-06-12 LAB — HEMOGLOBIN A1C
Hgb A1c MFr Bld: 6.1 % — ABNORMAL HIGH (ref 4.8–5.6)
Mean Plasma Glucose: 128.37 mg/dL

## 2022-06-12 LAB — CBC
HCT: 37.5 % — ABNORMAL LOW (ref 39.0–52.0)
Hemoglobin: 10.7 g/dL — ABNORMAL LOW (ref 13.0–17.0)
MCH: 19.8 pg — ABNORMAL LOW (ref 26.0–34.0)
MCHC: 28.5 g/dL — ABNORMAL LOW (ref 30.0–36.0)
MCV: 69.3 fL — ABNORMAL LOW (ref 80.0–100.0)
Platelets: 334 10*3/uL (ref 150–400)
RBC: 5.41 MIL/uL (ref 4.22–5.81)
RDW: 21.2 % — ABNORMAL HIGH (ref 11.5–15.5)
WBC: 7.5 10*3/uL (ref 4.0–10.5)
nRBC: 0.3 % — ABNORMAL HIGH (ref 0.0–0.2)

## 2022-06-12 SURGERY — ARTHROSCOPY, KNEE
Anesthesia: General | Site: Knee | Laterality: Left

## 2022-06-12 MED ORDER — ROCURONIUM BROMIDE 100 MG/10ML IV SOLN
INTRAVENOUS | Status: DC | PRN
  Administered 2022-06-12: 30 mg via INTRAVENOUS

## 2022-06-12 MED ORDER — EPINEPHRINE PF 1 MG/ML IJ SOLN
INTRAMUSCULAR | Status: AC
  Filled 2022-06-12: qty 1

## 2022-06-12 MED ORDER — SUCCINYLCHOLINE CHLORIDE 200 MG/10ML IV SOSY
PREFILLED_SYRINGE | INTRAVENOUS | Status: DC | PRN
  Administered 2022-06-12: 200 mg via INTRAVENOUS

## 2022-06-12 MED ORDER — OXYCODONE HCL 5 MG PO TABS
ORAL_TABLET | ORAL | Status: AC
  Administered 2022-06-12: 10 mg via ORAL
  Filled 2022-06-12: qty 2

## 2022-06-12 MED ORDER — DEXAMETHASONE SODIUM PHOSPHATE 10 MG/ML IJ SOLN
INTRAMUSCULAR | Status: AC
  Filled 2022-06-12: qty 1

## 2022-06-12 MED ORDER — FENTANYL CITRATE (PF) 250 MCG/5ML IJ SOLN
INTRAMUSCULAR | Status: AC
  Filled 2022-06-12: qty 5

## 2022-06-12 MED ORDER — CEFAZOLIN IN SODIUM CHLORIDE 3-0.9 GM/100ML-% IV SOLN
3.0000 g | INTRAVENOUS | Status: AC
  Administered 2022-06-12: 3 g via INTRAVENOUS
  Filled 2022-06-12: qty 100

## 2022-06-12 MED ORDER — PROMETHAZINE HCL 25 MG/ML IJ SOLN: 6.2500 mg | INTRAMUSCULAR | Status: DC | PRN

## 2022-06-12 MED ORDER — DEXTROSE 5 % IV SOLN: INTRAVENOUS | Status: DC | PRN

## 2022-06-12 MED ORDER — DEXMEDETOMIDINE HCL IN NACL 80 MCG/20ML IV SOLN
INTRAVENOUS | Status: DC | PRN
  Administered 2022-06-12: 4 ug via BUCCAL
  Administered 2022-06-12: 8 ug via BUCCAL

## 2022-06-12 MED ORDER — LACTATED RINGERS IV SOLN: INTRAVENOUS | Status: DC

## 2022-06-12 MED ORDER — LIDOCAINE HCL (CARDIAC) PF 100 MG/5ML IV SOSY
PREFILLED_SYRINGE | INTRAVENOUS | Status: DC | PRN
  Administered 2022-06-12: 100 mg via INTRAVENOUS

## 2022-06-12 MED ORDER — MIDAZOLAM HCL 2 MG/2ML IJ SOLN
INTRAMUSCULAR | Status: AC
  Filled 2022-06-12: qty 2

## 2022-06-12 MED ORDER — KETOROLAC TROMETHAMINE 30 MG/ML IJ SOLN
INTRAMUSCULAR | Status: AC
  Filled 2022-06-12: qty 1

## 2022-06-12 MED ORDER — PHENYLEPHRINE 80 MCG/ML (10ML) SYRINGE FOR IV PUSH (FOR BLOOD PRESSURE SUPPORT)
PREFILLED_SYRINGE | INTRAVENOUS | Status: DC | PRN
  Administered 2022-06-12 (×4): 160 ug via INTRAVENOUS

## 2022-06-12 MED ORDER — OXYCODONE HCL 5 MG PO TABS: 10.0000 mg | ORAL_TABLET | ORAL | Status: DC | PRN

## 2022-06-12 MED ORDER — ONDANSETRON HCL 4 MG/2ML IJ SOLN
INTRAMUSCULAR | Status: DC | PRN
  Administered 2022-06-12: 4 mg via INTRAVENOUS

## 2022-06-12 MED ORDER — ONDANSETRON HCL 4 MG/2ML IJ SOLN
INTRAMUSCULAR | Status: AC
  Filled 2022-06-12: qty 2

## 2022-06-12 MED ORDER — FENTANYL CITRATE PF 50 MCG/ML IJ SOSY
25.0000 ug | PREFILLED_SYRINGE | INTRAMUSCULAR | Status: DC | PRN
  Administered 2022-06-12: 50 ug via INTRAVENOUS

## 2022-06-12 MED ORDER — SUGAMMADEX SODIUM 200 MG/2ML IV SOLN
INTRAVENOUS | Status: DC | PRN
  Administered 2022-06-12: 400 mg via INTRAVENOUS

## 2022-06-12 MED ORDER — LIDOCAINE HCL (PF) 2 % IJ SOLN
INTRAMUSCULAR | Status: AC
  Filled 2022-06-12: qty 5

## 2022-06-12 MED ORDER — PROPOFOL 10 MG/ML IV BOLUS
INTRAVENOUS | Status: AC
  Filled 2022-06-12: qty 20

## 2022-06-12 MED ORDER — FENTANYL CITRATE (PF) 100 MCG/2ML IJ SOLN
INTRAMUSCULAR | Status: DC | PRN
  Administered 2022-06-12: 100 ug via INTRAVENOUS

## 2022-06-12 MED ORDER — PHENYLEPHRINE 80 MCG/ML (10ML) SYRINGE FOR IV PUSH (FOR BLOOD PRESSURE SUPPORT)
PREFILLED_SYRINGE | INTRAVENOUS | Status: AC
  Filled 2022-06-12: qty 10

## 2022-06-12 MED ORDER — ORAL CARE MOUTH RINSE: 15.0000 mL | Freq: Once | OROMUCOSAL | Status: AC

## 2022-06-12 MED ORDER — FENTANYL CITRATE PF 50 MCG/ML IJ SOSY
PREFILLED_SYRINGE | INTRAMUSCULAR | Status: AC
  Administered 2022-06-12: 50 ug via INTRAVENOUS
  Filled 2022-06-12: qty 2

## 2022-06-12 MED ORDER — ACETAMINOPHEN 500 MG PO TABS
1000.0000 mg | ORAL_TABLET | Freq: Once | ORAL | Status: AC
  Administered 2022-06-12: 1000 mg via ORAL
  Filled 2022-06-12: qty 2

## 2022-06-12 MED ORDER — CHLORHEXIDINE GLUCONATE 0.12 % MT SOLN
15.0000 mL | Freq: Once | OROMUCOSAL | Status: AC
  Administered 2022-06-12: 15 mL via OROMUCOSAL

## 2022-06-12 MED ORDER — BUPIVACAINE HCL (PF) 0.5 % IJ SOLN
INTRAMUSCULAR | Status: DC | PRN
  Administered 2022-06-12: 20 mL

## 2022-06-12 MED ORDER — PROPOFOL 10 MG/ML IV BOLUS
INTRAVENOUS | Status: DC | PRN
  Administered 2022-06-12: 250 mg via INTRAVENOUS
  Administered 2022-06-12: 60 mg via INTRAVENOUS
  Administered 2022-06-12: 20 mg via INTRAVENOUS
  Administered 2022-06-12: 40 mg via INTRAVENOUS
  Administered 2022-06-12: 20 mg via INTRAVENOUS

## 2022-06-12 MED ORDER — DEXMEDETOMIDINE HCL IN NACL 80 MCG/20ML IV SOLN
INTRAVENOUS | Status: AC
  Filled 2022-06-12: qty 20

## 2022-06-12 MED ORDER — MIDAZOLAM HCL 5 MG/5ML IJ SOLN
INTRAMUSCULAR | Status: DC | PRN
  Administered 2022-06-12: 2 mg via INTRAVENOUS

## 2022-06-12 MED ORDER — OXYCODONE-ACETAMINOPHEN 5-325 MG PO TABS: 1.0000 | ORAL_TABLET | Freq: Four times a day (QID) | ORAL | 0 refills | Status: AC | PRN

## 2022-06-12 MED ORDER — DEXAMETHASONE SODIUM PHOSPHATE 10 MG/ML IJ SOLN
INTRAMUSCULAR | Status: DC | PRN
  Administered 2022-06-12: 100 mg via INTRAVENOUS

## 2022-06-12 MED ORDER — EPINEPHRINE PF 1 MG/ML IJ SOLN
INTRAMUSCULAR | Status: DC | PRN
  Administered 2022-06-12: 1 mg

## 2022-06-12 MED ORDER — KETOROLAC TROMETHAMINE 30 MG/ML IJ SOLN
INTRAMUSCULAR | Status: DC | PRN
  Administered 2022-06-12 (×2): 15 mg via INTRAVENOUS

## 2022-06-12 MED ORDER — SODIUM CHLORIDE 0.9 % IR SOLN
Status: DC | PRN
  Administered 2022-06-12: 1000 mL

## 2022-06-12 MED ORDER — BUPIVACAINE HCL (PF) 0.5 % IJ SOLN
INTRAMUSCULAR | Status: AC
  Filled 2022-06-12: qty 30

## 2022-06-12 SURGICAL SUPPLY — 34 items
APL SKNCLS STERI-STRIP NONHPOA (GAUZE/BANDAGES/DRESSINGS) ×1
BAG COUNTER SPONGE SURGICOUNT (BAG) IMPLANT
BAG SPNG CNTER NS LX DISP (BAG)
BENZOIN TINCTURE PRP APPL 2/3 (GAUZE/BANDAGES/DRESSINGS) IMPLANT
BLADE EXCALIBUR 4.0X13 (MISCELLANEOUS) IMPLANT
BNDG CMPR MED 10X6 ELC LF (GAUZE/BANDAGES/DRESSINGS) ×1
BNDG ELASTIC 6X10 VLCR STRL LF (GAUZE/BANDAGES/DRESSINGS) IMPLANT
BNDG ELASTIC 6X5.8 VLCR STR LF (GAUZE/BANDAGES/DRESSINGS) ×2 IMPLANT
BOOTIES KNEE HIGH SLOAN (MISCELLANEOUS) ×4 IMPLANT
DISSECTOR 4.0MM X 13CM (MISCELLANEOUS) IMPLANT
DRAPE U-SHAPE 47X51 STRL (DRAPES) ×2 IMPLANT
DRSG EMULSION OIL 3X3 NADH (GAUZE/BANDAGES/DRESSINGS) ×2 IMPLANT
DURAPREP 26ML APPLICATOR (WOUND CARE) ×2 IMPLANT
FILTER STRAW (MISCELLANEOUS) ×2 IMPLANT
GAUZE 4X4 16PLY ~~LOC~~+RFID DBL (SPONGE) ×2 IMPLANT
GAUZE SPONGE 4X4 12PLY STRL (GAUZE/BANDAGES/DRESSINGS) ×2 IMPLANT
GLOVE BIOGEL PI IND STRL 8 (GLOVE) ×4 IMPLANT
GLOVE ECLIPSE 7.5 STRL STRAW (GLOVE) ×4 IMPLANT
GOWN STRL REUS W/ TWL XL LVL3 (GOWN DISPOSABLE) ×4 IMPLANT
GOWN STRL REUS W/TWL XL LVL3 (GOWN DISPOSABLE) ×2
KIT BASIN OR (CUSTOM PROCEDURE TRAY) ×2 IMPLANT
MANIFOLD NEPTUNE II (INSTRUMENTS) ×2 IMPLANT
NDL SAFETY ECLIP 18X1.5 (MISCELLANEOUS) ×2 IMPLANT
PACK ARTHROSCOPY WL (CUSTOM PROCEDURE TRAY) ×2 IMPLANT
PAD MASON LEG HOLDER (PIN) IMPLANT
PADDING CAST COTTON 6X4 STRL (CAST SUPPLIES) ×2 IMPLANT
PENCIL SMOKE EVACUATOR (MISCELLANEOUS) IMPLANT
PORT APPOLLO RF 90DEGREE MULTI (SURGICAL WAND) IMPLANT
STRIP CLOSURE SKIN 1/2X4 (GAUZE/BANDAGES/DRESSINGS) IMPLANT
SUT ETHILON 3 0 PS 1 (SUTURE) IMPLANT
SUT ETHILON 4 0 PS 2 18 (SUTURE) IMPLANT
SUT MNCRL AB 3-0 PS2 18 (SUTURE) IMPLANT
TUBING ARTHROSCOPY IRRIG 16FT (MISCELLANEOUS) ×2 IMPLANT
WRAP KNEE MAXI GEL POST OP (GAUZE/BANDAGES/DRESSINGS) ×2 IMPLANT

## 2022-06-12 NOTE — Brief Op Note (Signed)
06/12/2022  1:37 PM  PATIENT:  Mark Luna  42 y.o. male  PRE-OPERATIVE DIAGNOSIS:  LEFT KNEE LATERAL MENISCAL TEAR, PERI MENISCAL CYST  POST-OPERATIVE DIAGNOSIS:  LEFT KNEE LATERAL MENISCAL TEAR, PERI MENISCAL CYST  PROCEDURE:  Procedure(s): ARTHROSCOPY KNEE WITH OPEN CYST EXCISION, LATERAL MENISECTOMY,LATERALCHONDROPLASTY, MEDIAL PLICA EXCISION (Left)  SURGEON:  Surgeon(s) and Role:    Dorna Leitz, MD - Primary  PHYSICIAN ASSISTANT:   ASSISTANTS: Gaspar Skeeters  ANESTHESIA:   general  EBL:  min   BLOOD ADMINISTERED:none  DRAINS: none   LOCAL MEDICATIONS USED:  MARCAINE     SPECIMEN:  No Specimen  DISPOSITION OF SPECIMEN:  N/A  COUNTS:  YES  TOURNIQUET:  * No tourniquets in log *  DICTATION: .[id number]  PLAN OF CARE: Discharge to home after PACU  PATIENT DISPOSITION:  PACU - hemodynamically stable.   Delay start of Pharmacological VTE agent (>24hrs) due to surgical blood loss or risk of bleeding: no

## 2022-06-12 NOTE — Anesthesia Preprocedure Evaluation (Addendum)
Anesthesia Evaluation  Patient identified by MRN, date of birth, ID band Patient awake    Reviewed: Allergy & Precautions, NPO status , Patient's Chart, lab work & pertinent test results  History of Anesthesia Complications (+) PONV and history of anesthetic complications  Airway Mallampati: II  TM Distance: >3 FB Neck ROM: Full    Dental  (+) Teeth Intact, Dental Advisory Given   Pulmonary asthma , sleep apnea , Current Smoker and Patient abstained from smoking., PE   Pulmonary exam normal breath sounds clear to auscultation       Cardiovascular + Peripheral Vascular Disease and + DVT  Normal cardiovascular exam Rhythm:Regular Rate:Normal     Neuro/Psych negative neurological ROS     GI/Hepatic Neg liver ROS,GERD  Medicated,,Gastric bypass   Endo/Other    Morbid obesity (BMI 61)  Renal/GU negative Renal ROS     Musculoskeletal  (+) Arthritis ,  LEFT KNEE LATERAL MENISCAL TEAR, PERI MENISCAL CYST   Abdominal   Peds  Hematology negative hematology ROS (+)   Anesthesia Other Findings Day of surgery medications reviewed with the patient.  Reproductive/Obstetrics                             Anesthesia Physical Anesthesia Plan  ASA: 4  Anesthesia Plan: General   Post-op Pain Management: Tylenol PO (pre-op)* and Toradol IV (intra-op)*   Induction: Intravenous  PONV Risk Score and Plan: 2 and Midazolam, Dexamethasone and Ondansetron  Airway Management Planned: Oral ETT  Additional Equipment:   Intra-op Plan:   Post-operative Plan: Extubation in OR  Informed Consent: I have reviewed the patients History and Physical, chart, labs and discussed the procedure including the risks, benefits and alternatives for the proposed anesthesia with the patient or authorized representative who has indicated his/her understanding and acceptance.     Dental advisory given  Plan Discussed with:  CRNA  Anesthesia Plan Comments:        Anesthesia Quick Evaluation

## 2022-06-12 NOTE — Anesthesia Procedure Notes (Signed)
Procedure Name: Intubation Date/Time: 06/12/2022 12:41 PM  Performed by: Jenne Campus, CRNAPre-anesthesia Checklist: Patient identified, Emergency Drugs available, Suction available and Patient being monitored Patient Re-evaluated:Patient Re-evaluated prior to induction Oxygen Delivery Method: Circle system utilized Preoxygenation: Pre-oxygenation with 100% oxygen Induction Type: IV induction and Rapid sequence Ventilation: Two handed mask ventilation required Laryngoscope Size: Glidescope Grade View: Grade I Tube size: 8.0 mm Number of attempts: 1 Airway Equipment and Method: Stylet and Patient positioned with wedge pillow Placement Confirmation: ETT inserted through vocal cords under direct vision, positive ETCO2, CO2 detector and breath sounds checked- equal and bilateral Secured at: 23 cm Tube secured with: Tape Difficulty Due To: Difficulty was anticipated and Difficult Airway- due to large tongue Future Recommendations: Recommend- induction with short-acting agent, and alternative techniques readily available

## 2022-06-12 NOTE — Transfer of Care (Signed)
Immediate Anesthesia Transfer of Care Note  Patient: Mark Luna  Procedure(s) Performed: ARTHROSCOPY KNEE WITH OPEN CYST EXCISION, LATERAL MENISECTOMY,LATERALCHONDROPLASTY, MEDIAL PLICA EXCISION (Left: Knee)  Patient Location: PACU  Anesthesia Type:General  Level of Consciousness: awake and patient cooperative  Airway & Oxygen Therapy: Patient Spontanous Breathing and Patient connected to face mask oxygen  Post-op Assessment: Report given to RN and Post -op Vital signs reviewed and stable  Post vital signs: stable  Last Vitals:  Vitals Value Taken Time  BP 132/57 06/12/22 1346  Temp 36.5 C 06/12/22 1345  Pulse 78 06/12/22 1351  Resp 22 06/12/22 1351  SpO2 95 % 06/12/22 1351  Vitals shown include unvalidated device data.  Last Pain:  Vitals:   06/12/22 1123  TempSrc: Oral  PainSc:       Patients Stated Pain Goal: 0 (15/17/61 6073)  Complications:  Encounter Notable Events  Notable Event Outcome Phase Comment  Difficult to intubate - expected  Intraprocedure Filed from anesthesia note documentation.

## 2022-06-12 NOTE — Op Note (Signed)
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  PROCEDURES: 1.  Left knee arthroscopy with chondroplasty of grade IV chondral lesion on the lateral tibial plateau, anterior horn lateral meniscectomy, medial plica excision, and debridement of chondromalacia on the posterior aspect of the patella. 2.  Open excision of anterior horn parameniscal cyst.  SURGEON:  Alta Corning, MD.  ASSISTANT:  Gaspar Skeeters, PA-C, was present for the entire case and assisted by retraction of the leg, manipulation of the leg and closing to minimize OR time.  INDICATIONS:  The patient is a 42 year old male with a long history of severe left knee pain.  He was treated conservatively for a period of time with injection therapy, activity modification, medication and therapy.  After failure of all conservative  care MRI was obtained, which showed that he had anterior horn lateral meniscal tear as well as chondromalacia of the lateral compartment.  He was taken to the operating room for evaluation of procedure as necessary.  DESCRIPTION OF PROCEDURE:  The patient was taken to the operating room.  After adequate anesthesia obtained with general anesthetic, the patient was placed supine on the operating table.  Left leg was prepped and draped in the usual sterile fashion.   Following this, routine arthroscopic examination of the  knee revealed that there was chondromalacia in the patellofemoral joint.  This was debrided back to a smooth stable rim of articular cartilage.  There was a large medial shelf plica, which was  debrided back to the rim.  Attention was turned to the medial compartment where the medial meniscus was normal as well as the medial femoral condyle. Centrally the ACL was normal.  Attention turned to the lateral compartment where the dominant finding  was a large 1.5 x 1.5 cm area of exposed bone with surrounding edges of fray articular cartilage.  This was debrided back to a smooth and stable rim of articular cartilage and to bleeding bone and this unfortunately enlarged this to almost 2 cm and the  round.  Once this was done, attention was turned to the anterior horn lateral meniscus where there was a complex tear of the anterior horn.  This was debrided back to a smooth stable rim of meniscus.  Following this, the knee was copiously irrigated,  suctioned dry.  Attention was then turned to the lateral portal where the lateral portal was extended to allow access down to the joint line.  The cyst known from the MRI was just below the patellar tendon at the joint line anteriorly and towards the  midline.  We debrided down to this area and then took a rongeur and took out the fat and cystic structure that was in this area.  At this point, the wound was copiously irrigated, suction dry. The skin was closed with nylon interrupted sutures.  A  sterile compressive dressing was applied.  The patient was taken to recovery was noted to be in satisfactory condition.  Estimated  blood loss for procedure was minimal.   PUS D: 06/12/2022 1:42:36 pm T: 06/12/2022 1:58:00 pm  JOB: [id number]/ [id number]

## 2022-06-12 NOTE — Discharge Instructions (Signed)
POST-OP KNEE ARTHROSCOPY INSTRUCTIONS  Dr. Alain Marion PA-C  Pain You will be expected to have a moderate amount of pain in the affected knee for approximately two weeks. However, the first two days will be the most severe pain. A prescription has been provided to take as needed for the pain. The pain can be reduced by applying ice packs to the knee for the first 1-2 weeks post surgery. Also, keeping the leg elevated on pillows will help alleviate the pain. If you develop any acute pain or swelling in your calf muscle, please call the doctor.  Activity It is preferred that you stay at bed rest for approximately 24 hours. However, you may go to the bathroom with help. Weight bearing as tolerated. You may begin the knee exercises the day of surgery. Discontinue crutches as the knee pain resolves.  Dressing Keep the dressing dry. If the ace bandage should wrinkle or roll up, this can be rewrapped to prevent ridges in the bandage. You may remove all dressings in 48 hours,  apply bandaids to each wound. You may shower on the 4th day after surgery but no tub bath.  Since you have a small incision from the cyst excision keep that dry.  You can shower with a waterproof Band-Aid over it and then reapply the Ace bandage.    Symptoms to report to your doctor Extreme pain Extreme swelling Temperature above 101 degrees Change in the feeling, color, or movement of your toes Redness, heat, or swelling at your incision  Exercise If is preferred that as soon as possible you try to do a straight leg raise without bending the knee and concentrate on bringing the heel of your foot off the bed up to approximately 45 degrees and hold for the count of 10 seconds. Repeat this at least 10 times three or four times per day. Additional exercises are provided below.  You are encouraged to bend the knee as tolerated.  Follow-Up Call to schedule a follow-up appointment in 5-7 days. Our office # is  [phone].  POST-OP EXERCISES  Short Arc Quads  1. Lie on back with legs straight. Place towel roll under thigh, just above knee. 2. Tighten thigh muscles to straighten knee and lift heel off bed. 3. Hold for slow count of five, then lower. 4. Do three sets of ten    Straight Leg Raises  1. Lie on back with operative leg straight and other leg bent. 2. Keeping operative leg completely straight, slowly lift operative leg so foot is 5 inches off bed. 3. Hold for slow count of five, then lower. 4. Do three sets of ten.    DO BOTH EXERCISES 2 TIMES A DAY  Ankle Pumps  Work/move the operative ankle and foot up and down 10 times every hour while awake.

## 2022-06-12 NOTE — Anesthesia Postprocedure Evaluation (Signed)
Anesthesia Post Note  Patient: [facility]  Procedure(s) Performed: ARTHROSCOPY KNEE WITH OPEN CYST EXCISION, LATERAL MENISECTOMY,LATERALCHONDROPLASTY, MEDIAL PLICA EXCISION (Left: Knee)     Patient location during evaluation: PACU Anesthesia Type: General Level of consciousness: awake Pain management: pain level controlled Vital Signs Assessment: post-procedure vital signs reviewed and stable Respiratory status: spontaneous breathing, nonlabored ventilation and respiratory function stable Cardiovascular status: blood pressure returned to baseline and stable Postop Assessment: no apparent nausea or vomiting Anesthetic complications: yes   Encounter Notable Events  Notable Event Outcome Phase Comment  Difficult to intubate - expected  Intraprocedure Filed from anesthesia note documentation.    Last Vitals:  Vitals:   06/12/22 1505 06/12/22 1515  BP: 124/71 129/80  Pulse: 74 72  Resp:    Temp:    SpO2: 92% 97%    Last Pain:  Vitals:   06/12/22 1515  TempSrc:   PainSc: 9                  Nilda Simmer

## 2022-06-13 ENCOUNTER — Encounter (HOSPITAL_COMMUNITY): Payer: Self-pay | Admitting: Orthopedic Surgery

## 2022-07-12 ENCOUNTER — Other Ambulatory Visit (HOSPITAL_COMMUNITY): Payer: Self-pay | Admitting: Orthopedic Surgery

## 2022-07-12 ENCOUNTER — Ambulatory Visit (HOSPITAL_COMMUNITY)
Admission: RE | Admit: 2022-07-12 | Discharge: 2022-07-12 | Disposition: A | Discharge status: Home / Self Care | Payer: BLUE CROSS/BLUE SHIELD | Source: Ambulatory Visit | Attending: Cardiovascular Disease | Admitting: Cardiovascular Disease

## 2022-07-12 DIAGNOSIS — M79605 Pain in left leg: Secondary | ICD-10-CM

## 2023-12-19 ENCOUNTER — Emergency Department (HOSPITAL_COMMUNITY)

## 2023-12-19 ENCOUNTER — Encounter (HOSPITAL_COMMUNITY): Payer: Self-pay

## 2023-12-19 ENCOUNTER — Other Ambulatory Visit: Payer: Self-pay

## 2023-12-19 ENCOUNTER — Emergency Department (HOSPITAL_COMMUNITY)
Admission: EM | Admit: 2023-12-19 | Discharge: 2023-12-19 | Disposition: A | Discharge status: Home / Self Care | Attending: Emergency Medicine | Admitting: Emergency Medicine

## 2023-12-19 DIAGNOSIS — M79662 Pain in left lower leg: Secondary | ICD-10-CM | POA: Insufficient documentation

## 2023-12-19 DIAGNOSIS — Z91040 Latex allergy status: Secondary | ICD-10-CM | POA: Diagnosis not present

## 2023-12-19 DIAGNOSIS — M79605 Pain in left leg: Secondary | ICD-10-CM

## 2023-12-19 LAB — CBC WITH DIFFERENTIAL/PLATELET
Abs Immature Granulocytes: 0.04 K/uL (ref 0.00–0.07)
Basophils Absolute: 0.1 K/uL (ref 0.0–0.1)
Basophils Relative: 1 %
Eosinophils Absolute: 0.2 K/uL (ref 0.0–0.5)
Eosinophils Relative: 2 %
HCT: 47.7 % (ref 39.0–52.0)
Hemoglobin: 14.9 g/dL (ref 13.0–17.0)
Immature Granulocytes: 1 %
Lymphocytes Relative: 25 %
Lymphs Abs: 2.2 K/uL (ref 0.7–4.0)
MCH: 25.6 pg — ABNORMAL LOW (ref 26.0–34.0)
MCHC: 31.2 g/dL (ref 30.0–36.0)
MCV: 81.8 fL (ref 80.0–100.0)
Monocytes Absolute: 0.7 K/uL (ref 0.1–1.0)
Monocytes Relative: 7 %
Neutro Abs: 5.6 K/uL (ref 1.7–7.7)
Neutrophils Relative %: 64 %
Platelets: 309 K/uL (ref 150–400)
RBC: 5.83 MIL/uL — ABNORMAL HIGH (ref 4.22–5.81)
RDW: 18.7 % — ABNORMAL HIGH (ref 11.5–15.5)
WBC: 8.9 K/uL (ref 4.0–10.5)
nRBC: 0 % (ref 0.0–0.2)

## 2023-12-19 LAB — BASIC METABOLIC PANEL WITH GFR
Anion gap: 9 (ref 5–15)
BUN: 9 mg/dL (ref 6–20)
CO2: 24 mmol/L (ref 22–32)
Calcium: 9 mg/dL (ref 8.9–10.3)
Chloride: 102 mmol/L (ref 98–111)
Creatinine, Ser: 0.93 mg/dL (ref 0.61–1.24)
GFR, Estimated: >60 mL/min (ref >60)
Glucose, Bld: 96 mg/dL (ref 70–99)
Potassium: 4.5 mmol/L (ref 3.5–5.1)
Sodium: 135 mmol/L (ref 135–145)

## 2023-12-19 LAB — D-DIMER, QUANTITATIVE: D-Dimer, Quant: <0.27 ug{FEU}/mL (ref 0.00–0.50)

## 2023-12-19 MED ORDER — HYDROCODONE-ACETAMINOPHEN 5-325 MG PO TABS
2.0000 | ORAL_TABLET | Freq: Once | ORAL | Status: AC
  Administered 2023-12-19: 2 via ORAL
  Filled 2023-12-19: qty 2

## 2023-12-19 NOTE — Discharge Instructions (Signed)
 As we discussed, your lab work and your ultrasound is normal today.  If you have more pain in your leg, I recommend that you follow-up with orthopedic doctor  Return to ER if you have worse leg pain or swelling

## 2023-12-19 NOTE — ED Triage Notes (Addendum)
 PT sent by [facility] for US  for DVT rule out. Pt has hx of DVT and PE.  PT complains of L lower leg pain but no SOB at this time

## 2023-12-19 NOTE — ED Notes (Signed)
 Pt transported to vascular.

## 2023-12-19 NOTE — ED Notes (Signed)
 Patient Alert and oriented to baseline. Stable and ambulatory to baseline. Patient verbalized understanding of the discharge instructions.  Patient belongings were taken by the patient.

## 2023-12-19 NOTE — ED Provider Notes (Signed)
 [place] [facility] AT [facility] Provider Note   CSN: [id number] Arrival date & time: 12/19/23  1128     Patient presents with: DVT rule out   Mark Luna is a 43 y.o. male here presenting with leg pain.  Patient has been having left leg pain for a week now and worse in the last day or so.  Denies any recent travel.  Went to urgent care and sent here for rule out DVT.  Denies any chest pain or shortness of breath   The history is provided by the patient.       Prior to Admission medications   Medication Sig Start Date End Date Taking? Authorizing Provider  Cyanocobalamin (VITAMIN B 12 PO) Take 1 tablet by mouth daily.    [provider]  omeprazole (PRILOSEC) 40 MG capsule Take 40 mg by mouth daily.    [provider]  oxyCODONE -acetaminophen  (PERCOCET/ROXICET) 5-325 MG tablet Take 1-2 tablets by mouth every 6 (six) hours as needed for severe pain. 06/12/22   Rondall Agent, PA-C    Allergies: Latex    Review of Systems  Musculoskeletal:        Left leg pain  All other systems reviewed and are negative.   Updated Vital Signs BP 121/81   Pulse 69   Temp 98.1 F (36.7 C)   Resp 18   Ht 5' 6 (1.676 m)   Wt (!) 160.1 kg   SpO2 97%   BMI 56.98 kg/m   Physical Exam Vitals and nursing note reviewed.  HENT:     Head: Normocephalic.     Nose: Nose normal.     Mouth/Throat:     Mouth: Mucous membranes are moist.  Eyes:     Pupils: Pupils are equal, round, and reactive to light.  Cardiovascular:     Rate and Rhythm: Normal rate and regular rhythm.     Pulses: Normal pulses.     Heart sounds: Normal heart sounds.  Pulmonary:     Effort: Pulmonary effort is normal.     Breath sounds: Normal breath sounds.  Abdominal:     General: Abdomen is flat.     Palpations: Abdomen is soft.  Musculoskeletal:     Cervical back: Normal range of motion.     Comments: Mild left calf tenderness.  Skin:    General: Skin is warm.      Capillary Refill: Capillary refill takes less than 2 seconds.  Neurological:     General: No focal deficit present.     Mental Status: He is alert and oriented to person, place, and time.  Psychiatric:        Mood and Affect: Mood normal.        Behavior: Behavior normal.     (all labs ordered are listed, but only abnormal results are displayed) Labs Reviewed  CBC WITH DIFFERENTIAL/PLATELET - Abnormal; Notable for the following components:      Result Value   RBC 5.83 (*)    MCH 25.6 (*)    RDW 18.7 (*)    All other components within normal limits  BASIC METABOLIC PANEL WITH GFR  D-DIMER, QUANTITATIVE    EKG: None  Radiology: VAS US  LOWER EXTREMITY VENOUS (DVT) (7a-7p) Result Date: 12/19/2023  Lower Venous DVT Study Patient Name:  Mark Luna  Date of Exam:   12/19/2023 Medical Rec #: [id number]          Accession #:    [id number] Date  of Birth: Mar 21, 1981          Patient Gender: M Patient Age:   59 years Exam Location:  [facility] Procedure:      VAS US  LOWER EXTREMITY VENOUS (DVT) Referring Phys: JONATHAN GILLIAM --------------------------------------------------------------------------------  Indications: Pain.  Risk Factors: Hx of DVT in LLE 3 years ago. Limitations: Body habitus and poor ultrasound/tissue interface. Comparison Study: Previous exam on 07/12/2022 was negative for DVT. Performing Technologist: Ezzie Potters RVT, RDMS  Examination Guidelines: A complete evaluation includes B-mode imaging, spectral Doppler, color Doppler, and power Doppler as needed of all accessible portions of each vessel. Bilateral testing is considered an integral part of a complete examination. Limited examinations for reoccurring indications may be performed as noted. The reflux portion of the exam is performed with the patient in reverse Trendelenburg.  +-----+---------------+---------+-----------+----------+--------------+ RIGHTCompressibilityPhasicitySpontaneityPropertiesThrombus  Aging +-----+---------------+---------+-----------+----------+--------------+ CFV  Full           Yes      Yes                                 +-----+---------------+---------+-----------+----------+--------------+   +---------+---------------+---------+-----------+----------+-------------------+ LEFT     CompressibilityPhasicitySpontaneityPropertiesThrombus Aging      +---------+---------------+---------+-----------+----------+-------------------+ CFV      Full           Yes      Yes                                      +---------+---------------+---------+-----------+----------+-------------------+ SFJ      Full                                                             +---------+---------------+---------+-----------+----------+-------------------+ FV Prox  Full           Yes      Yes                                      +---------+---------------+---------+-----------+----------+-------------------+ FV Mid   Full           Yes      Yes                                      +---------+---------------+---------+-----------+----------+-------------------+ FV DistalFull           Yes      Yes                                      +---------+---------------+---------+-----------+----------+-------------------+ PFV      Full                                                             +---------+---------------+---------+-----------+----------+-------------------+ POP      Full  Yes      Yes                                      +---------+---------------+---------+-----------+----------+-------------------+ PTV      Full                                                             +---------+---------------+---------+-----------+----------+-------------------+ PERO     Full                                         Not well visualized +---------+---------------+---------+-----------+----------+-------------------+    Summary: RIGHT: - No  evidence of common femoral vein obstruction.   LEFT: - There is no evidence of deep vein thrombosis in the lower extremity.  - No cystic structure found in the popliteal fossa.  *See table(s) above for measurements and observations.    Preliminary      Procedures   Medications Ordered in the ED  HYDROcodone -acetaminophen  (NORCO/VICODIN) 5-325 MG per tablet 2 tablet (2 tablets Oral Given 12/19/23 1407)                                    Medical Decision Making Mark Luna is a 43 y.o. male here presenting with left leg pain.  I reviewed patient's labs and DVT study in triage and they were unremarkable.  Will refer to Ortho outpatient.      Final diagnoses:  Leg pain, left    ED Discharge Orders     None          Patt Alm Macho, MD 12/19/23 [phone]

## 2023-12-19 NOTE — Progress Notes (Signed)
 LLE venous duplex has been completed.  Preliminary results given to Dr. Patt.   Results can be found under chart review under CV PROC. 12/19/2023 5:13 PM Dura Mccormack RVT, RDMS

## 2023-12-19 NOTE — ED Provider Triage Note (Signed)
 Emergency Medicine Provider Triage Evaluation Note  Mark Luna , a 43 y.o. male  was evaluated in triage.  Pt complains of left lower extremity pain that he describes as stabbing and severe that has been persistent over the last week but has been persistently worse over the last 24 hours.  Has had no recent injuries to the left lower extremity but states that it originates in the anterior surface of the distal lower extremity and radiates throughout through the anterior knee.  Denies noting any swelling to the leg however states that the pain has been progressively worse, has previous history of VTE with pulmonary embolus.  Does not currently take any anticoagulation.  Previously seen at urgent care, provided with Tylenol , x-ray imaging taken without any notable findings.  Review of Systems  Positive: As above Negative:   Physical Exam  BP (!) 140/72 (BP Location: Right Arm)   Pulse 86   Temp (!) 97.3 F (36.3 C)   Resp 19   Ht 5' 6 (1.676 m)   Wt (!) 160.1 kg   SpO2 96%   BMI 56.98 kg/m  Gen:   Awake, no distress   Resp:  Normal effort  MSK:   Moves extremities without difficulty  Other:  Left lower extremity is tender to palpation particularly along the calf muscle, small edema is noted along the calf muscle as well.  Medical Decision Making  Medically screening exam initiated at 2:04 PM.  Appropriate orders placed.  Mark Luna was informed that the remainder of the evaluation will be completed by another provider, this initial triage assessment does not replace that evaluation, and the importance of remaining in the ED until their evaluation is complete.  Ultrasound imaging of the left leg ordered as well as basic lab work and D-dimer to evaluate for VTE.   Myriam Dorn [place], [place] 12/19/23 1407
